# Patient Record
Sex: Female | Born: 1975 | Race: White | Hispanic: No | Marital: Married | Smoking: Former smoker
Health system: Southern US, Community
[De-identification: ages and names within clinical notes are randomized; demographics above are authoritative.]

## PROBLEM LIST (undated history)

## (undated) DIAGNOSIS — F419 Anxiety disorder, unspecified: Secondary | ICD-10-CM

## (undated) HISTORY — PX: AUGMENTATION MAMMAPLASTY: SUR837

## (undated) HISTORY — PX: TONSILLECTOMY: SUR1361

---

## 2003-04-28 ENCOUNTER — Other Ambulatory Visit: Admission: RE | Admit: 2003-04-28 | Discharge: 2003-04-28 | Payer: Self-pay | Admitting: Obstetrics and Gynecology

## 2004-09-06 ENCOUNTER — Other Ambulatory Visit: Admission: RE | Admit: 2004-09-06 | Discharge: 2004-09-06 | Payer: Self-pay | Admitting: Obstetrics and Gynecology

## 2008-07-17 ENCOUNTER — Ambulatory Visit: Payer: Self-pay | Admitting: Diagnostic Radiology

## 2008-07-17 ENCOUNTER — Emergency Department (HOSPITAL_BASED_OUTPATIENT_CLINIC_OR_DEPARTMENT_OTHER): Admission: EM | Admit: 2008-07-17 | Discharge: 2008-07-17 | Payer: Self-pay | Admitting: Emergency Medicine

## 2008-08-21 ENCOUNTER — Emergency Department (HOSPITAL_BASED_OUTPATIENT_CLINIC_OR_DEPARTMENT_OTHER): Admission: EM | Admit: 2008-08-21 | Discharge: 2008-08-21 | Payer: Self-pay | Admitting: Emergency Medicine

## 2009-01-19 ENCOUNTER — Encounter: Admission: RE | Admit: 2009-01-19 | Discharge: 2009-01-19 | Payer: Self-pay | Admitting: Obstetrics and Gynecology

## 2010-03-17 ENCOUNTER — Encounter: Payer: Self-pay | Admitting: Obstetrics and Gynecology

## 2010-05-24 ENCOUNTER — Other Ambulatory Visit: Payer: Self-pay | Admitting: Obstetrics and Gynecology

## 2010-05-24 DIAGNOSIS — Z803 Family history of malignant neoplasm of breast: Secondary | ICD-10-CM

## 2010-05-24 DIAGNOSIS — N6310 Unspecified lump in the right breast, unspecified quadrant: Secondary | ICD-10-CM

## 2010-05-31 ENCOUNTER — Ambulatory Visit
Admission: RE | Admit: 2010-05-31 | Discharge: 2010-05-31 | Disposition: A | Discharge status: Home / Self Care | Payer: BC Managed Care – PPO | Source: Ambulatory Visit | Attending: Obstetrics and Gynecology | Admitting: Obstetrics and Gynecology

## 2010-05-31 DIAGNOSIS — Z803 Family history of malignant neoplasm of breast: Secondary | ICD-10-CM

## 2010-05-31 DIAGNOSIS — N6310 Unspecified lump in the right breast, unspecified quadrant: Secondary | ICD-10-CM

## 2010-06-04 LAB — CBC
HCT: 39 % (ref 36.0–46.0)
Hemoglobin: 13.1 g/dL (ref 12.0–15.0)
MCHC: 33.6 g/dL (ref 30.0–36.0)
MCV: 84.5 fL (ref 78.0–100.0)
RBC: 4.61 MIL/uL (ref 3.87–5.11)

## 2010-06-04 LAB — DIFFERENTIAL
Basophils Relative: 1 % (ref 0–1)
Eosinophils Relative: 1 % (ref 0–5)
Monocytes Absolute: 0.6 10*3/uL (ref 0.1–1.0)
Monocytes Relative: 7 % (ref 3–12)
Neutro Abs: 5.5 10*3/uL (ref 1.7–7.7)

## 2010-06-04 LAB — COMPREHENSIVE METABOLIC PANEL
CO2: 27 mEq/L (ref 19–32)
Calcium: 9.4 mg/dL (ref 8.4–10.5)
Creatinine, Ser: 0.8 mg/dL (ref 0.4–1.2)
Glucose, Bld: 85 mg/dL (ref 70–99)

## 2010-06-04 LAB — URINALYSIS, ROUTINE W REFLEX MICROSCOPIC
Glucose, UA: NEGATIVE mg/dL
Hgb urine dipstick: NEGATIVE
Specific Gravity, Urine: 1.025 (ref 1.005–1.030)
Urobilinogen, UA: 1 mg/dL (ref 0.0–1.0)
pH: 7.5 (ref 5.0–8.0)

## 2010-06-05 ENCOUNTER — Other Ambulatory Visit: Payer: Self-pay | Admitting: Obstetrics and Gynecology

## 2010-06-05 DIAGNOSIS — Z803 Family history of malignant neoplasm of breast: Secondary | ICD-10-CM

## 2010-06-05 DIAGNOSIS — N6489 Other specified disorders of breast: Secondary | ICD-10-CM

## 2010-06-27 ENCOUNTER — Ambulatory Visit
Admission: RE | Admit: 2010-06-27 | Discharge: 2010-06-27 | Disposition: A | Discharge status: Home / Self Care | Payer: BC Managed Care – PPO | Source: Ambulatory Visit | Attending: Obstetrics and Gynecology | Admitting: Obstetrics and Gynecology

## 2010-06-27 DIAGNOSIS — N6489 Other specified disorders of breast: Secondary | ICD-10-CM

## 2010-06-27 DIAGNOSIS — Z803 Family history of malignant neoplasm of breast: Secondary | ICD-10-CM

## 2010-06-27 MED ORDER — GADOBENATE DIMEGLUMINE 529 MG/ML IV SOLN
17.0000 mL | Freq: Once | INTRAVENOUS | Status: AC | PRN
  Administered 2010-06-27: 17 mL via INTRAVENOUS

## 2010-08-16 ENCOUNTER — Other Ambulatory Visit (INDEPENDENT_AMBULATORY_CARE_PROVIDER_SITE_OTHER): Payer: Self-pay | Admitting: General Surgery

## 2010-08-16 DIAGNOSIS — R928 Other abnormal and inconclusive findings on diagnostic imaging of breast: Secondary | ICD-10-CM

## 2010-09-20 ENCOUNTER — Encounter (HOSPITAL_BASED_OUTPATIENT_CLINIC_OR_DEPARTMENT_OTHER)
Admission: RE | Admit: 2010-09-20 | Discharge: 2010-09-20 | Disposition: A | Discharge status: Home / Self Care | Payer: BC Managed Care – PPO | Source: Ambulatory Visit | Attending: General Surgery | Admitting: General Surgery

## 2010-09-20 LAB — DIFFERENTIAL
Basophils Relative: 1 % (ref 0–1)
Eosinophils Relative: 1 % (ref 0–5)
Monocytes Absolute: 0.5 10*3/uL (ref 0.1–1.0)
Monocytes Relative: 6 % (ref 3–12)
Neutro Abs: 5.3 10*3/uL (ref 1.7–7.7)

## 2010-09-20 LAB — CBC
HCT: 38.2 % (ref 36.0–46.0)
Hemoglobin: 12.7 g/dL (ref 12.0–15.0)
MCH: 28.3 pg (ref 26.0–34.0)
MCHC: 33.2 g/dL (ref 30.0–36.0)

## 2010-09-25 ENCOUNTER — Other Ambulatory Visit (INDEPENDENT_AMBULATORY_CARE_PROVIDER_SITE_OTHER): Payer: Self-pay | Admitting: General Surgery

## 2010-09-25 ENCOUNTER — Ambulatory Visit
Admission: RE | Admit: 2010-09-25 | Discharge: 2010-09-25 | Disposition: A | Discharge status: Home / Self Care | Payer: BC Managed Care – PPO | Source: Ambulatory Visit | Attending: General Surgery | Admitting: General Surgery

## 2010-09-25 ENCOUNTER — Ambulatory Visit (HOSPITAL_BASED_OUTPATIENT_CLINIC_OR_DEPARTMENT_OTHER)
Admission: RE | Admit: 2010-09-25 | Discharge: 2010-09-25 | Disposition: A | Discharge status: Home / Self Care | Payer: BC Managed Care – PPO | Source: Ambulatory Visit | Attending: General Surgery | Admitting: General Surgery

## 2010-09-25 DIAGNOSIS — Z01812 Encounter for preprocedural laboratory examination: Secondary | ICD-10-CM | POA: Insufficient documentation

## 2010-09-25 DIAGNOSIS — R928 Other abnormal and inconclusive findings on diagnostic imaging of breast: Secondary | ICD-10-CM

## 2010-09-25 DIAGNOSIS — L905 Scar conditions and fibrosis of skin: Secondary | ICD-10-CM | POA: Insufficient documentation

## 2010-09-25 DIAGNOSIS — Z803 Family history of malignant neoplasm of breast: Secondary | ICD-10-CM | POA: Insufficient documentation

## 2010-09-25 HISTORY — PX: BREAST BIOPSY: SHX20

## 2010-10-03 NOTE — Op Note (Signed)
NAMENEVADA, KIRCHNER NO.:  [id number]  MEDICAL RECORD NO.:  [id number]  LOCATION:                                 FACILITY:  PHYSICIAN:  Juanetta Gosling, MD     DATE OF BIRTH:  DATE OF PROCEDURE:  09/25/2010 DATE OF DISCHARGE:                              OPERATIVE REPORT   PREOPERATIVE DIAGNOSIS:  Right breast mammographic abnormality.  POSTOPERATIVE DIAGNOSIS:  Right breast mammographic abnormality.  PROCEDURE:  Right breast wire localization biopsy.  SURGEON:  Juanetta Gosling, MD  ASSISTANT:  None.  ANESTHESIA:  General.  SPECIMENS:  Right breast tissue marked with a long stitch lateral, short stitch superior, double stitch deep.  ESTIMATED BLOOD LOSS:  Minimal.  COMPLICATIONS:  None.  DRAINS:  None.  FINDINGS:  Mammographic confirmation of removal of wire and lesion.  DISPOSITION:  The patient to recovery room in stable condition.  INDICATIONS:  This is a 35 year old female who underwent a mammogram. Previously, it looked like there be an abnormal area of distortion.  She had a family history of breast cancer that has come up this year in her mother and underwent a followup mammogram which shows some distortion above the level of the nipple in the lateral portion of her breast it appeared to be.  She underwent an MR before she saw me, which was also negative.  She then came to see me for evaluation for excision as this was not amenable to radiologic-guided biopsy.  I discussed with due to her family history as well as a mammographic abnormality, I think this would be best served with an excision.  She was agreeable to that.  We discussed the risks and benefits associated with that procedure.  After informed consent was obtained, the patient was first taken [facility] where she had her wire placed by Dr. Anselmo Pickler.  I had the mammograms available for my review prior to beginning the operation.  She was then brought to  the operating room.  She underwent administration 1 gram of intravenous cefazolin.  She then had sequential compression devices placed.  She underwent general anesthesia with an LMA without complication.  Her right breast was then prepped and draped in standard sterile surgical fashion.  A surgical time-out was then performed.  I infiltrated 0.25% Marcaine throughout the lateral portion of the right breast.  I then made a radial incision overlying the wire tract. Cautery was then used to excise the wire as well as the surrounding tissue.  All of her breast tissue was very hard and there certainly appeared to be some cystic structures of all, but there certainly was no discrete mass associated with this.  I took this down to near her chest wall, but did not go all the way and remove her fascia with this.  The lesion was then passed off the table after being marked with short stitch superior, long stitch lateral, double stitch deep.  Confirmation of removal of lesion was then made with Dr. Jean Rosenthal.  I then obtained hemostasis.  I closed the breast with 3-0 Vicryl, 4-0 Monocryl, Steri-Strips, and dressing were placed over this.  She tolerated this well, extubated  in the operating room, and transferred to recovery room in stable condition.     Juanetta Gosling, MD     MCW/MEDQ  D:  09/25/2010  T:  09/26/2010  Job:  [id number]  cc:   Randye Lobo, M.D. Stoney Bang, MD  Electronically Signed by Emelia Loron MD on 10/03/2010 02:01:23 PM

## 2010-10-09 ENCOUNTER — Telehealth (INDEPENDENT_AMBULATORY_CARE_PROVIDER_SITE_OTHER): Payer: Self-pay

## 2010-10-09 NOTE — Telephone Encounter (Signed)
LMOM for pt to call me back to get her scheduled with Dr Dwain Sarna next wk after her lumpectomy/ [facility]

## 2010-10-15 ENCOUNTER — Telehealth (INDEPENDENT_AMBULATORY_CARE_PROVIDER_SITE_OTHER): Payer: Self-pay

## 2010-10-15 NOTE — Telephone Encounter (Signed)
LMOM  Stating that Dr Dwain Sarna wants to see her for her po appt and for her to please call me. I also lmom on 10-09-10 but never heard back from her.Hulda Humphrey

## 2010-10-30 ENCOUNTER — Encounter (INDEPENDENT_AMBULATORY_CARE_PROVIDER_SITE_OTHER): Payer: Self-pay | Admitting: General Surgery

## 2010-10-30 ENCOUNTER — Ambulatory Visit (INDEPENDENT_AMBULATORY_CARE_PROVIDER_SITE_OTHER): Payer: BC Managed Care – PPO | Admitting: General Surgery

## 2010-10-30 VITALS — BP 112/78 | HR 80

## 2010-10-30 DIAGNOSIS — Z09 Encounter for follow-up examination after completed treatment for conditions other than malignant neoplasm: Secondary | ICD-10-CM

## 2010-10-30 NOTE — Progress Notes (Signed)
Subjective:     Patient ID: Dawn Holder, female   DOB: [birth year], 35 y.o.   MRN: [id number]  HPI This is a 35 year old female who I initially saw in June of 2012 for a right breast mammographic abnormality that could not be biopsied stereotactically. I then took her to the operating room and a wire localization biopsy of this area with her pathology showing a radial scar. She has been well since her operation. She resumed her activity earlier this week and after doing pushups and a heavy work out she began developing pain in her axilla as well as around her incision. She also felt some warmth in her breast. This is gotten better with some rest.  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 I recommended to her scheduled anti-inflammatories for 5 days followed by p.r.n. I also recommended easy offer activity for a time. I asked her if this is not better in 3-4 weeks for her to come back and see me.  She has a significant family history of a mom with breast cancer at age 75, 2 aunts with breast cancer, one aunt with ovarian cancer and possibly another cousin with ovarian cancer as well. I discussed with her she now has an abnormality in her breast as well as a very significant family history. I recommended to her and evaluation at the cancer center for possible genetic counseling and testing as well as a high risk evaluation.

## 2011-02-07 ENCOUNTER — Other Ambulatory Visit: Payer: Self-pay | Admitting: Obstetrics and Gynecology

## 2011-03-01 ENCOUNTER — Telehealth: Payer: Self-pay | Admitting: Family

## 2011-03-01 NOTE — Telephone Encounter (Signed)
called pts lmovm to rtn call to schedule appt

## 2011-04-16 ENCOUNTER — Encounter: Payer: Self-pay | Admitting: *Deleted

## 2011-04-16 NOTE — Progress Notes (Signed)
Mailed letter to pt about High Risk clinic. 

## 2012-02-24 ENCOUNTER — Other Ambulatory Visit: Payer: Self-pay

## 2012-04-12 ENCOUNTER — Other Ambulatory Visit: Payer: Self-pay | Admitting: Obstetrics and Gynecology

## 2012-04-12 DIAGNOSIS — Z1231 Encounter for screening mammogram for malignant neoplasm of breast: Secondary | ICD-10-CM

## 2012-05-10 ENCOUNTER — Other Ambulatory Visit: Payer: Self-pay | Admitting: Otolaryngology

## 2012-05-10 ENCOUNTER — Ambulatory Visit: Payer: BC Managed Care – PPO

## 2012-06-14 ENCOUNTER — Ambulatory Visit: Payer: BC Managed Care – PPO

## 2013-02-23 ENCOUNTER — Other Ambulatory Visit: Payer: Self-pay | Admitting: Obstetrics & Gynecology

## 2013-02-23 DIAGNOSIS — N6002 Solitary cyst of left breast: Secondary | ICD-10-CM

## 2013-02-25 ENCOUNTER — Other Ambulatory Visit: Payer: Self-pay

## 2013-02-28 ENCOUNTER — Ambulatory Visit
Admission: RE | Admit: 2013-02-28 | Discharge: 2013-02-28 | Disposition: A | Discharge status: Home / Self Care | Payer: BC Managed Care – PPO | Source: Ambulatory Visit | Attending: Obstetrics & Gynecology | Admitting: Obstetrics & Gynecology

## 2013-02-28 ENCOUNTER — Other Ambulatory Visit: Payer: BC Managed Care – PPO

## 2013-02-28 DIAGNOSIS — N6002 Solitary cyst of left breast: Secondary | ICD-10-CM

## 2013-07-25 ENCOUNTER — Other Ambulatory Visit: Payer: Self-pay | Admitting: Family Medicine

## 2013-07-25 DIAGNOSIS — N6009 Solitary cyst of unspecified breast: Secondary | ICD-10-CM

## 2013-07-29 ENCOUNTER — Other Ambulatory Visit: Payer: Self-pay | Admitting: Gastroenterology

## 2013-07-29 DIAGNOSIS — K5900 Constipation, unspecified: Secondary | ICD-10-CM

## 2013-08-15 ENCOUNTER — Ambulatory Visit
Admission: RE | Admit: 2013-08-15 | Discharge: 2013-08-15 | Disposition: A | Discharge status: Home / Self Care | Payer: BC Managed Care – PPO | Source: Ambulatory Visit | Attending: Gastroenterology | Admitting: Gastroenterology

## 2013-08-15 DIAGNOSIS — K5900 Constipation, unspecified: Secondary | ICD-10-CM

## 2013-09-05 ENCOUNTER — Ambulatory Visit
Admission: RE | Admit: 2013-09-05 | Discharge: 2013-09-05 | Disposition: A | Discharge status: Home / Self Care | Payer: BC Managed Care – PPO | Source: Ambulatory Visit | Attending: Family Medicine | Admitting: Family Medicine

## 2013-09-05 DIAGNOSIS — N6009 Solitary cyst of unspecified breast: Secondary | ICD-10-CM

## 2013-09-21 ENCOUNTER — Emergency Department (HOSPITAL_BASED_OUTPATIENT_CLINIC_OR_DEPARTMENT_OTHER)
Admission: EM | Admit: 2013-09-21 | Discharge: 2013-09-21 | Disposition: A | Discharge status: Home / Self Care | Payer: BC Managed Care – PPO | Attending: Emergency Medicine | Admitting: Emergency Medicine

## 2013-09-21 ENCOUNTER — Encounter (HOSPITAL_BASED_OUTPATIENT_CLINIC_OR_DEPARTMENT_OTHER): Payer: Self-pay | Admitting: Emergency Medicine

## 2013-09-21 DIAGNOSIS — Z79899 Other long term (current) drug therapy: Secondary | ICD-10-CM | POA: Diagnosis not present

## 2013-09-21 DIAGNOSIS — R509 Fever, unspecified: Secondary | ICD-10-CM | POA: Diagnosis not present

## 2013-09-21 DIAGNOSIS — R197 Diarrhea, unspecified: Secondary | ICD-10-CM | POA: Diagnosis present

## 2013-09-21 DIAGNOSIS — R110 Nausea: Secondary | ICD-10-CM | POA: Diagnosis not present

## 2013-09-21 DIAGNOSIS — IMO0001 Reserved for inherently not codable concepts without codable children: Secondary | ICD-10-CM | POA: Diagnosis not present

## 2013-09-21 DIAGNOSIS — R5383 Other fatigue: Secondary | ICD-10-CM | POA: Diagnosis not present

## 2013-09-21 DIAGNOSIS — R5381 Other malaise: Secondary | ICD-10-CM | POA: Diagnosis not present

## 2013-09-21 DIAGNOSIS — R51 Headache: Secondary | ICD-10-CM | POA: Diagnosis not present

## 2013-09-21 DIAGNOSIS — Z3202 Encounter for pregnancy test, result negative: Secondary | ICD-10-CM | POA: Insufficient documentation

## 2013-09-21 DIAGNOSIS — Z87891 Personal history of nicotine dependence: Secondary | ICD-10-CM | POA: Diagnosis not present

## 2013-09-21 DIAGNOSIS — M791 Myalgia, unspecified site: Secondary | ICD-10-CM

## 2013-09-21 DIAGNOSIS — R638 Other symptoms and signs concerning food and fluid intake: Secondary | ICD-10-CM | POA: Diagnosis not present

## 2013-09-21 LAB — LIPASE, BLOOD: Lipase: 30 U/L (ref 11–59)

## 2013-09-21 LAB — COMPREHENSIVE METABOLIC PANEL
ALK PHOS: 49 U/L (ref 39–117)
ALT: 11 U/L (ref 0–35)
AST: 17 U/L (ref 0–37)
Albumin: 3.5 g/dL (ref 3.5–5.2)
Anion gap: 15 (ref 5–15)
BUN: 8 mg/dL (ref 6–23)
CO2: 19 meq/L (ref 19–32)
Calcium: 8.6 mg/dL (ref 8.4–10.5)
Chloride: 102 mEq/L (ref 96–112)
Creatinine, Ser: 0.8 mg/dL (ref 0.50–1.10)
GFR calc non Af Amer: >90 mL/min (ref >90)
GLUCOSE: 120 mg/dL — AB (ref 70–99)
Potassium: 3.4 mEq/L — ABNORMAL LOW (ref 3.7–5.3)
SODIUM: 136 meq/L — AB (ref 137–147)
TOTAL PROTEIN: 6.7 g/dL (ref 6.0–8.3)
Total Bilirubin: <0.2 mg/dL — ABNORMAL LOW (ref 0.3–1.2)

## 2013-09-21 LAB — CBC WITH DIFFERENTIAL/PLATELET
Basophils Absolute: 0 10*3/uL (ref 0.0–0.1)
Basophils Relative: 0 % (ref 0–1)
Eosinophils Absolute: 0 10*3/uL (ref 0.0–0.7)
Eosinophils Relative: 1 % (ref 0–5)
HCT: 39.1 % (ref 36.0–46.0)
Hemoglobin: 13 g/dL (ref 12.0–15.0)
LYMPHS ABS: 0.4 10*3/uL — AB (ref 0.7–4.0)
LYMPHS PCT: 8 % — AB (ref 12–46)
MCH: 28.8 pg (ref 26.0–34.0)
MCHC: 33.2 g/dL (ref 30.0–36.0)
MCV: 86.5 fL (ref 78.0–100.0)
Monocytes Absolute: 0.3 10*3/uL (ref 0.1–1.0)
Monocytes Relative: 8 % (ref 3–12)
NEUTROS PCT: 83 % — AB (ref 43–77)
Neutro Abs: 3.6 10*3/uL (ref 1.7–7.7)
PLATELETS: 164 10*3/uL (ref 150–400)
RBC: 4.52 MIL/uL (ref 3.87–5.11)
RDW: 13.4 % (ref 11.5–15.5)
WBC: 4.4 10*3/uL (ref 4.0–10.5)

## 2013-09-21 LAB — URINALYSIS, ROUTINE W REFLEX MICROSCOPIC
BILIRUBIN URINE: NEGATIVE
Glucose, UA: NEGATIVE mg/dL
KETONES UR: 15 mg/dL — AB
NITRITE: NEGATIVE
PROTEIN: NEGATIVE mg/dL
Specific Gravity, Urine: 1.017 (ref 1.005–1.030)
Urobilinogen, UA: 1 mg/dL (ref 0.0–1.0)
pH: 6 (ref 5.0–8.0)

## 2013-09-21 LAB — URINE MICROSCOPIC-ADD ON

## 2013-09-21 LAB — PREGNANCY, URINE: Preg Test, Ur: NEGATIVE

## 2013-09-21 MED ORDER — POTASSIUM CHLORIDE CRYS ER 20 MEQ PO TBCR
40.0000 meq | EXTENDED_RELEASE_TABLET | Freq: Once | ORAL | Status: AC
  Administered 2013-09-21: 40 meq via ORAL
  Filled 2013-09-21: qty 2

## 2013-09-21 MED ORDER — SODIUM CHLORIDE 0.9 % IV BOLUS (SEPSIS)
2000.0000 mL | Freq: Once | INTRAVENOUS | Status: AC
  Administered 2013-09-21: 2000 mL via INTRAVENOUS

## 2013-09-21 MED ORDER — ONDANSETRON HCL 4 MG/2ML IJ SOLN
4.0000 mg | Freq: Once | INTRAMUSCULAR | Status: AC
  Administered 2013-09-21: 4 mg via INTRAVENOUS
  Filled 2013-09-21: qty 2

## 2013-09-21 MED ORDER — LOPERAMIDE HCL 2 MG PO CAPS
4.0000 mg | ORAL_CAPSULE | ORAL | Status: DC | PRN
  Administered 2013-09-21: 4 mg via ORAL
  Filled 2013-09-21: qty 2

## 2013-09-21 MED ORDER — MORPHINE SULFATE 2 MG/ML IJ SOLN
2.0000 mg | Freq: Once | INTRAMUSCULAR | Status: AC
  Administered 2013-09-21: 2 mg via INTRAVENOUS
  Filled 2013-09-21: qty 1

## 2013-09-21 NOTE — ED Notes (Signed)
C/o diarrhea, chills x 4 days

## 2013-09-21 NOTE — ED Provider Notes (Signed)
CSN: [id number]     Arrival date & time 09/21/13  2012 History   First MD Initiated Contact with Patient 09/21/13 2024     Chief Complaint  Patient presents with  . Diarrhea     (Consider location/radiation/quality/duration/timing/severity/associated sxs/prior Treatment) The history is provided by the patient and medical records. No language interpreter was used.    Dawn Holder is a 38 y.o. female  with no major medical Hx presents to the Emergency Department complaining of gradual, persistent, progressively worsening diarrhea onset 3 days. Associated symptoms include nausea, chills, body aches, low grade fever (100.0 TMAX).  Pt reports several days of epigastric pain that has resolved.  Pt reports diarrhea is watery and without hematochezia.  Pt reports several black stools after taking pepto bismol, but this has resolved completely and it was never tarry or coffee ground appearing.  Pt reports she is having diarrhea multiple times per hour.  Pt reports lack of appetite and feeling poorly. Pt denies sick contacts, recent travel, recent antibiotics or visits to the hospital.  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   History reviewed. No pertinent past medical history. Past Surgical History  Procedure Laterality Date  . Breast biopsy  September 25, 2010    rt  . Tonsillectomy     Family History  Problem Relation Age of Onset  . Cancer Mother     breast   History  Substance Use Topics  . Smoking status: Former Games developermoker  . Smokeless tobacco: Not on file  . Alcohol Use: No   OB History   Grav Para Term Preterm Abortions TAB SAB Ect Mult Living                 Review of Systems  Constitutional: Positive for fever, chills, appetite change and fatigue. Negative for diaphoresis and unexpected weight change.  HENT: Negative for mouth sores.   Eyes: Negative for visual disturbance.  Respiratory: Negative for cough, chest tightness, shortness of breath and wheezing.   Cardiovascular:  Negative for chest pain.  Gastrointestinal: Positive for nausea, abdominal pain (epigastric, resolved) and diarrhea. Negative for vomiting and constipation.  Endocrine: Negative for polydipsia, polyphagia and polyuria.  Genitourinary: Negative for dysuria, urgency, frequency and hematuria.  Musculoskeletal: Positive for myalgias. Negative for back pain and neck stiffness.  Skin: Negative for rash and wound.  Allergic/Immunologic: Negative for immunocompromised state.  Neurological: Positive for headaches (mild, generalized). Negative for dizziness, syncope and light-headedness.  Hematological: Does not bruise/bleed easily.  Psychiatric/Behavioral: Negative for sleep disturbance. The patient is not nervous/anxious.       Allergies  Oxycodone  Home Medications   Prior to Admission medications   Medication Sig Start Date End Date Taking? Authorizing Provider  BUPROPION HBR ER PO Take by mouth.   Yes Historical Provider, MD  amphetamine-dextroamphetamine (ADDERALL) 20 MG tablet Take 20 mg by mouth daily.      Historical Provider, MD  levonorgestrel-ethinyl estradiol (NORDETTE) 0.15-30 MG-MCG tablet Take 1 tablet by mouth daily.      Historical Provider, MD  Multiple Vitamin (MULTIVITAMIN) capsule Take 1 capsule by mouth daily.      Historical Provider, MD  Omega-3 Fatty Acids (FISH OIL) 1200 MG CAPS Take by mouth daily.      Historical Provider, MD   BP 119/64  Pulse 88  Temp(Src) 98.2 F (36.8 C) (Oral)  Resp 18  Ht 5\' 11"  (1.803 m)  Wt 164 lb (74.39 kg)  BMI 22.88 kg/m2  SpO2 99%  LMP 09/14/2013 Physical Exam  Nursing note and vitals reviewed. Constitutional: She is oriented to person, place, and time. She appears well-developed and well-nourished. No distress.  Awake, alert, nontoxic appearance  HENT:  Head: Normocephalic and atraumatic.  Mouth/Throat: Oropharynx is clear and moist. No oropharyngeal exudate.  Eyes: Conjunctivae are normal. No scleral icterus.  Neck: Normal  range of motion. Neck supple.  Cardiovascular: Normal rate, regular rhythm, normal heart sounds and intact distal pulses.   No murmur heard. Pulmonary/Chest: Effort normal and breath sounds normal. No respiratory distress. She has no wheezes.  Abdominal: Soft. Bowel sounds are normal. She exhibits no distension and no mass. There is no tenderness. There is no rebound and no guarding.  Pt reports mild, generalized soreness without tenderness, rebound or peritoneal signs. Abdomen is soft without masses No CVA tenderness  Musculoskeletal: Normal range of motion. She exhibits no edema.  Neurological: She is alert and oriented to person, place, and time. She exhibits normal muscle tone. Coordination normal.  Speech is clear and goal oriented Moves extremities without ataxia  Skin: Skin is warm and dry. She is not diaphoretic. No erythema.  Psychiatric: She has a normal mood and affect. Her behavior is normal.    ED Course  Procedures (including critical care time) Labs Review Labs Reviewed  URINALYSIS, ROUTINE W REFLEX MICROSCOPIC - Abnormal; Notable for the following:    APPearance CLOUDY (*)    Hgb urine dipstick TRACE (*)    Ketones, ur 15 (*)    Leukocytes, UA SMALL (*)    All other components within normal limits  URINE MICROSCOPIC-ADD ON - Abnormal; Notable for the following:    Squamous Epithelial / LPF FEW (*)    All other components within normal limits  CBC WITH DIFFERENTIAL - Abnormal; Notable for the following:    Neutrophils Relative % 83 (*)    Lymphocytes Relative 8 (*)    Lymphs Abs 0.4 (*)    All other components within normal limits  COMPREHENSIVE METABOLIC PANEL - Abnormal; Notable for the following:    Sodium 136 (*)    Potassium 3.4 (*)    Glucose, Bld 120 (*)    Total Bilirubin <0.2 (*)    All other components within normal limits  OVA AND PARASITE EXAMINATION  PREGNANCY, URINE  LIPASE, BLOOD  GI PATHOGEN PANEL BY PCR, STOOL  POC OCCULT BLOOD, ED     Imaging Review No results found.   EKG Interpretation None      MDM   Final diagnoses:  Diarrhea  Myalgia   Dawn Holder presents with body aches, low-grade fever, resolved abdominal pain and watery diarrhea for 4 days.  Abdomen soft and nontender.  Will check basic labs and send stool cultures.  10:43 PM Labs unremarkable.  Patient unable to produce thecal sample here in the emergency department. She reports feeling better after fluid bolus and pain medications.  Vitals improved the patient is no longer tachycardic. Labs reassuring. Mild hypokalemia replaced here in the emergency department. No evidence of urinary tract infection.  Patient without high risk for C. difficile or traveler's diarrhea.    Patient without anemia and denies blood in her stool.  Unable to produce sample therefore no fecal occult either.  Highly doubt bloody diarrhea.  I have personally reviewed patient's vitals, nursing note and any pertinent labs or imaging.  I performed an undressed physical exam.    At this time, it has been determined that no acute conditions requiring further emergency intervention.  The patient/guardian have been advised of the diagnosis and plan. I reviewed all labs and imaging including any potential incidental findings. We have discussed signs and symptoms that warrant return to the ED, such as high fevers, persistent diarrhea, bloody diarrhea or vomiting.  Patient/guardian has voiced understanding and agreed to follow-up with the PCP or specialist in 24 hours.  Vital signs are stable at discharge.   BP 119/64  Pulse 88  Temp(Src) 98.2 F (36.8 C) (Oral)  Resp 18  Ht 5\' 11"  (1.803 m)  Wt 164 lb (74.39 kg)  BMI 22.88 kg/m2  SpO2 99%  LMP 09/14/2013        Dierdre Forth, PA-C 09/21/13 2257

## 2013-09-21 NOTE — Discharge Instructions (Signed)
1. Medications: imodium for diarrhea, usual home medications 2. Treatment: rest, drink plenty of fluids, collect stool sample as able 3. Follow Up: Please followup with your primary doctor tomorrow for discussion of your diagnoses and further evaluation after today's visit; if you do not have a primary care doctor use the resource guide provided to find one;     Diarrhea Diarrhea is frequent loose and watery bowel movements. It can cause you to feel weak and dehydrated. Dehydration can cause you to become tired and thirsty, have a dry mouth, and have decreased urination that often is dark yellow. Diarrhea is a sign of another problem, most often an infection that will not last long. In most cases, diarrhea typically lasts 2-3 days. However, it can last longer if it is a sign of something more serious. It is important to treat your diarrhea as directed by your caregiver to lessen or prevent future episodes of diarrhea. CAUSES  Some common causes include:  Gastrointestinal infections caused by viruses, bacteria, or parasites.  Food poisoning or food allergies.  Certain medicines, such as antibiotics, chemotherapy, and laxatives.  Artificial sweeteners and fructose.  Digestive disorders. HOME CARE INSTRUCTIONS  Ensure adequate fluid intake (hydration): Have 1 cup (8 oz) of fluid for each diarrhea episode. Avoid fluids that contain simple sugars or sports drinks, fruit juices, whole milk products, and sodas. Your urine should be clear or pale yellow if you are drinking enough fluids. Hydrate with an oral rehydration solution that you can purchase at pharmacies, retail stores, and online. You can prepare an oral rehydration solution at home by mixing the following ingredients together:   - tsp table salt.   tsp baking soda.   tsp salt substitute containing potassium chloride.  1  tablespoons sugar.  1 L (34 oz) of water.  Certain foods and beverages may increase the speed at which food  moves through the gastrointestinal (GI) tract. These foods and beverages should be avoided and include:  Caffeinated and alcoholic beverages.  High-fiber foods, such as raw fruits and vegetables, nuts, seeds, and whole grain breads and cereals.  Foods and beverages sweetened with sugar alcohols, such as xylitol, sorbitol, and mannitol.  Some foods may be well tolerated and may help thicken stool including:  Starchy foods, such as rice, toast, pasta, low-sugar cereal, oatmeal, grits, baked potatoes, crackers, and bagels.  Bananas.  Applesauce.  Add probiotic-rich foods to help increase healthy bacteria in the GI tract, such as yogurt and fermented milk products.  Wash your hands well after each diarrhea episode.  Only take over-the-counter or prescription medicines as directed by your caregiver.  Take a warm bath to relieve any burning or pain from frequent diarrhea episodes. SEEK IMMEDIATE MEDICAL CARE IF:   You are unable to keep fluids down.  You have persistent vomiting.  You have blood in your stool, or your stools are black and tarry.  You do not urinate in 6-8 hours, or there is only a small amount of very dark urine.  You have abdominal pain that increases or localizes.  You have weakness, dizziness, confusion, or light-headedness.  You have a severe headache.  Your diarrhea gets worse or does not get better.  You have a fever or persistent symptoms for more than 2-3 days.  You have a fever and your symptoms suddenly get worse. MAKE SURE YOU:   Understand these instructions.  Will watch your condition.  Will get help right away if you are not doing well or get  worse. Document Released: 01/31/2002 Document Revised: 06/27/2013 Document Reviewed: 10/19/2011 [facility] Patient Information 2015 [place], [place]. This information is not intended to replace advice given to you by your health care provider. Make sure you discuss any questions you have with your health  care provider.  Food Choices to Help Relieve Diarrhea When you have diarrhea, the foods you eat and your eating habits are very important. Choosing the right foods and drinks can help relieve diarrhea. Also, because diarrhea can last up to 7 days, you need to replace lost fluids and electrolytes (such as sodium, potassium, and chloride) in order to help prevent dehydration.  WHAT GENERAL GUIDELINES DO I NEED TO FOLLOW?  Slowly drink 1 cup (8 oz) of fluid for each episode of diarrhea. If you are getting enough fluid, your urine will be clear or pale yellow.  Eat starchy foods. Some good choices include white rice, white toast, pasta, low-fiber cereal, baked potatoes (without the skin), saltine crackers, and bagels.  Avoid large servings of any cooked vegetables.  Limit fruit to two servings per day. A serving is  cup or 1 small piece.  Choose foods with less than 2 g of fiber per serving.  Limit fats to less than 8 tsp (38 g) per day.  Avoid fried foods.  Eat foods that have probiotics in them. Probiotics can be found in certain dairy products.  Avoid foods and beverages that may increase the speed at which food moves through the stomach and intestines (gastrointestinal tract). Things to avoid include:  High-fiber foods, such as dried fruit, raw fruits and vegetables, nuts, seeds, and whole grain foods.  Spicy foods and high-fat foods.  Foods and beverages sweetened with high-fructose corn syrup, honey, or sugar alcohols such as xylitol, sorbitol, and mannitol. WHAT FOODS ARE RECOMMENDED? Grains White rice. White, [place], or pita breads (fresh or toasted), including plain rolls, buns, or bagels. White pasta. Saltine, soda, or graham crackers. Pretzels. Low-fiber cereal. Cooked cereals made with water (such as cornmeal, farina, or cream cereals). Plain muffins. Matzo. Melba toast. Zwieback.  Vegetables Potatoes (without the skin). Strained tomato and vegetable juices. Most well-cooked  and canned vegetables without seeds. Tender lettuce. Fruits Cooked or canned applesauce, apricots, cherries, fruit cocktail, grapefruit, peaches, pears, or plums. Fresh bananas, apples without skin, cherries, grapes, cantaloupe, grapefruit, peaches, oranges, or plums.  Meat and Other Protein Products Baked or boiled chicken. Eggs. Tofu. Fish. Seafood. Smooth peanut butter. Ground or well-cooked tender beef, ham, veal, lamb, pork, or poultry.  Dairy Plain yogurt, kefir, and unsweetened liquid yogurt. Lactose-free milk, buttermilk, or soy milk. Plain hard cheese. Beverages Sport drinks. Clear broths. Diluted fruit juices (except prune). Regular, caffeine-free sodas such as ginger ale. Water. Decaffeinated teas. Oral rehydration solutions. Sugar-free beverages not sweetened with sugar alcohols. Other Bouillon, broth, or soups made from recommended foods.  The items listed above may not be a complete list of recommended foods or beverages. Contact your dietitian for more options. WHAT FOODS ARE NOT RECOMMENDED? Grains Whole grain, whole wheat, bran, or rye breads, rolls, pastas, crackers, and cereals. Wild or brown rice. Cereals that contain more than 2 g of fiber per serving. Corn tortillas or taco shells. Cooked or dry oatmeal. Granola. Popcorn. Vegetables Raw vegetables. Cabbage, broccoli, Brussels sprouts, artichokes, baked beans, beet greens, corn, kale, legumes, peas, sweet potatoes, and yams. Potato skins. Cooked spinach and cabbage. Fruits Dried fruit, including raisins and dates. Raw fruits. Stewed or dried prunes. Fresh apples with skin, apricots, mangoes, pears, raspberries,  and strawberries.  Meat and Other Protein Products Chunky peanut butter. Nuts and seeds. Beans and lentils. Tomasa Blase.  Dairy High-fat cheeses. Milk, chocolate milk, and beverages made with milk, such as milk shakes. Cream. Ice cream. Sweets and Desserts Sweet rolls, doughnuts, and sweet breads. Pancakes and  waffles. Fats and Oils Butter. Cream sauces. Margarine. Salad oils. Plain salad dressings. Olives. Avocados.  Beverages Caffeinated beverages (such as coffee, tea, soda, or energy drinks). Alcoholic beverages. Fruit juices with pulp. Prune juice. Soft drinks sweetened with high-fructose corn syrup or sugar alcohols. Other Coconut. Hot sauce. Chili powder. Mayonnaise. Gravy. Cream-based or milk-based soups.  The items listed above may not be a complete list of foods and beverages to avoid. Contact your dietitian for more information. WHAT SHOULD I DO IF I BECOME DEHYDRATED? Diarrhea can sometimes lead to dehydration. Signs of dehydration include dark urine and dry mouth and skin. If you think you are dehydrated, you should rehydrate with an oral rehydration solution. These solutions can be purchased at pharmacies, retail stores, or online.  Drink -1 cup (120-240 mL) of oral rehydration solution each time you have an episode of diarrhea. If drinking this amount makes your diarrhea worse, try drinking smaller amounts more often. For example, drink 1-3 tsp (5-15 mL) every 5-10 minutes.  A general rule for staying hydrated is to drink 1-2 L of fluid per day. Talk to your health care provider about the specific amount you should be drinking each day. Drink enough fluids to keep your urine clear or pale yellow. Document Released: 05/03/2003 Document Revised: 02/15/2013 Document Reviewed: 01/03/2013 [facility] Patient Information 2015 [place], [place]. This information is not intended to replace advice given to you by your health care provider. Make sure you discuss any questions you have with your health care provider.

## 2013-09-22 NOTE — ED Provider Notes (Signed)
Medical screening examination/treatment/procedure(s) were performed by non-physician practitioner and as supervising physician I was immediately available for consultation/collaboration.   EKG Interpretation None        Sherley Leser S Kathyrn Warmuth, MD 09/22/13 1530 

## 2014-04-03 ENCOUNTER — Other Ambulatory Visit: Payer: Self-pay

## 2014-04-03 DIAGNOSIS — Z1231 Encounter for screening mammogram for malignant neoplasm of breast: Secondary | ICD-10-CM

## 2014-04-10 ENCOUNTER — Ambulatory Visit: Payer: Self-pay

## 2014-05-01 ENCOUNTER — Ambulatory Visit: Payer: Self-pay

## 2014-05-22 ENCOUNTER — Ambulatory Visit: Payer: Self-pay

## 2014-05-25 ENCOUNTER — Ambulatory Visit
Admission: RE | Admit: 2014-05-25 | Discharge: 2014-05-25 | Disposition: A | Discharge status: Home / Self Care | Payer: BLUE CROSS/BLUE SHIELD | Source: Ambulatory Visit

## 2014-05-25 DIAGNOSIS — Z1231 Encounter for screening mammogram for malignant neoplasm of breast: Secondary | ICD-10-CM

## 2014-05-26 ENCOUNTER — Other Ambulatory Visit: Payer: Self-pay | Admitting: Family Medicine

## 2014-05-26 DIAGNOSIS — R928 Other abnormal and inconclusive findings on diagnostic imaging of breast: Secondary | ICD-10-CM

## 2014-06-05 ENCOUNTER — Ambulatory Visit
Admission: RE | Admit: 2014-06-05 | Discharge: 2014-06-05 | Disposition: A | Discharge status: Home / Self Care | Payer: BLUE CROSS/BLUE SHIELD | Source: Ambulatory Visit | Attending: Family Medicine | Admitting: Family Medicine

## 2014-06-05 DIAGNOSIS — R928 Other abnormal and inconclusive findings on diagnostic imaging of breast: Secondary | ICD-10-CM

## 2014-06-27 ENCOUNTER — Emergency Department (HOSPITAL_BASED_OUTPATIENT_CLINIC_OR_DEPARTMENT_OTHER): Payer: BLUE CROSS/BLUE SHIELD

## 2014-06-27 ENCOUNTER — Encounter (HOSPITAL_BASED_OUTPATIENT_CLINIC_OR_DEPARTMENT_OTHER): Payer: Self-pay | Admitting: *Deleted

## 2014-06-27 ENCOUNTER — Emergency Department (HOSPITAL_BASED_OUTPATIENT_CLINIC_OR_DEPARTMENT_OTHER)
Admission: EM | Admit: 2014-06-27 | Discharge: 2014-06-27 | Disposition: A | Discharge status: Home / Self Care | Payer: BLUE CROSS/BLUE SHIELD | Attending: Emergency Medicine | Admitting: Emergency Medicine

## 2014-06-27 DIAGNOSIS — R05 Cough: Secondary | ICD-10-CM

## 2014-06-27 DIAGNOSIS — R1110 Vomiting, unspecified: Secondary | ICD-10-CM | POA: Insufficient documentation

## 2014-06-27 DIAGNOSIS — F419 Anxiety disorder, unspecified: Secondary | ICD-10-CM | POA: Insufficient documentation

## 2014-06-27 DIAGNOSIS — J40 Bronchitis, not specified as acute or chronic: Secondary | ICD-10-CM | POA: Diagnosis not present

## 2014-06-27 DIAGNOSIS — Z87891 Personal history of nicotine dependence: Secondary | ICD-10-CM | POA: Diagnosis not present

## 2014-06-27 DIAGNOSIS — R059 Cough, unspecified: Secondary | ICD-10-CM

## 2014-06-27 DIAGNOSIS — Z792 Long term (current) use of antibiotics: Secondary | ICD-10-CM | POA: Insufficient documentation

## 2014-06-27 DIAGNOSIS — Z793 Long term (current) use of hormonal contraceptives: Secondary | ICD-10-CM | POA: Diagnosis not present

## 2014-06-27 HISTORY — DX: Anxiety disorder, unspecified: F41.9

## 2014-06-27 MED ORDER — HYDROCOD POLST-CPM POLST ER 10-8 MG/5ML PO SUER: 5.0000 mL | Freq: Two times a day (BID) | ORAL | Status: AC | PRN

## 2014-06-27 MED ORDER — PREDNISONE 20 MG PO TABS: ORAL_TABLET | ORAL | Status: AC

## 2014-06-27 MED ORDER — ALBUTEROL SULFATE HFA 108 (90 BASE) MCG/ACT IN AERS
2.0000 | INHALATION_SPRAY | RESPIRATORY_TRACT | Status: DC | PRN
  Administered 2014-06-27: 2 via RESPIRATORY_TRACT
  Filled 2014-06-27: qty 6.7

## 2014-06-27 NOTE — Discharge Instructions (Signed)
Cough, Adult  A cough is a reflex that helps clear your throat and airways. It can help heal the body or may be a reaction to an irritated airway. A cough may only last 2 or 3 weeks (acute) or may last more than 8 weeks (chronic).  CAUSES Acute cough:  Viral or bacterial infections. Chronic cough:  Infections.  Allergies.  Asthma.  Post-nasal drip.  Smoking.  Heartburn or acid reflux.  Some medicines.  Chronic lung problems (COPD).  Cancer. SYMPTOMS   Cough.  Fever.  Chest pain.  Increased breathing rate.  High-pitched whistling sound when breathing (wheezing).  Colored mucus that you cough up (sputum). TREATMENT   A bacterial cough may be treated with antibiotic medicine.  A viral cough must run its course and will not respond to antibiotics.  Your caregiver may recommend other treatments if you have a chronic cough. HOME CARE INSTRUCTIONS   Only take over-the-counter or prescription medicines for pain, discomfort, or fever as directed by your caregiver. Use cough suppressants only as directed by your caregiver.  Use a cold steam vaporizer or humidifier in your bedroom or home to help loosen secretions.  Sleep in a semi-upright position if your cough is worse at night.  Rest as needed.  Stop smoking if you smoke. SEEK IMMEDIATE MEDICAL CARE IF:   You have pus in your sputum.  Your cough starts to worsen.  You cannot control your cough with suppressants and are losing sleep.  You begin coughing up blood.  You have difficulty breathing.  You develop pain which is getting worse or is uncontrolled with medicine.  You have a fever. MAKE SURE YOU:   Understand these instructions.  Will watch your condition.  Will get help right away if you are not doing well or get worse. Document Released: 08/09/2010 Document Revised: 05/05/2011 Document Reviewed: 08/09/2010 [employer] Patient Information 2015 [employer]. This information is not intended  to replace advice given to you by your health care provider. Make sure you discuss any questions you have with your health care provider.  

## 2014-06-27 NOTE — ED Notes (Signed)
Pt amb to triage with quick steady gait in nad. Pt reports cough, sore throat x last wed, seen at urgent care and rx amox for ear infection, cough syrup and tessilon pearls. Pt states sx are no better. Denies any fevers.

## 2014-06-27 NOTE — ED Provider Notes (Signed)
CSN: [id number]     Arrival date & time 06/27/14  1413 History   First MD Initiated Contact with Patient 06/27/14 1507     Chief Complaint  Patient presents with  . Cough     (Consider location/radiation/quality/duration/timing/severity/associated sxs/prior Treatment) HPI Comments: Patient presents with cough and congestion. She states that she's had about a one-week history of runny nose congestion coughing. She initially was using over-the-counter cough medicines and then was seen at an urgent care 4 days ago and started on amoxicillin for an ear infection as well as promethazine DM cough syrup and Tessalon Perles. She states that the cough medicine is her only health maintenance making her dizzy. She states the cough is worsening. She's not coughing up any sputum. Cough is worse when she lays down. She feels short of breath at times. She has no chest pain. No leg swelling. No fevers but she is having chills. She did have an episode of vomiting last night that was associated with coughing.  Patient is a 39 y.o. female presenting with cough.  Cough Associated symptoms: chills, rhinorrhea and shortness of breath   Associated symptoms: no chest pain, no diaphoresis, no fever, no headaches and no rash     Past Medical History  Diagnosis Date  . Anxiety    Past Surgical History  Procedure Laterality Date  . Breast biopsy  September 25, 2010    rt  . Tonsillectomy     Family History  Problem Relation Age of Onset  . Cancer Mother     breast   History  Substance Use Topics  . Smoking status: Former Games developer  . Smokeless tobacco: Not on file  . Alcohol Use: No   OB History    No data available     Review of Systems  Constitutional: Positive for chills, appetite change and fatigue. Negative for fever and diaphoresis.  HENT: Positive for congestion, postnasal drip, rhinorrhea and voice change (hoarseness). Negative for sneezing and trouble swallowing.   Eyes: Negative.   Respiratory:  Positive for cough and shortness of breath. Negative for chest tightness.   Cardiovascular: Negative for chest pain and leg swelling.  Gastrointestinal: Positive for vomiting. Negative for nausea, abdominal pain, diarrhea and blood in stool.  Genitourinary: Negative for frequency, hematuria, flank pain and difficulty urinating.  Musculoskeletal: Negative for back pain and arthralgias.  Skin: Negative for rash.  Neurological: Negative for dizziness, speech difficulty, weakness, numbness and headaches.      Allergies  Oxycodone  Home Medications   Prior to Admission medications   Medication Sig Start Date End Date Taking? Authorizing Provider  amoxicillin (AMOXIL) 875 MG tablet Take 875 mg by mouth 2 (two) times daily.   Yes Historical Provider, MD  amphetamine-dextroamphetamine (ADDERALL) 20 MG tablet Take 20 mg by mouth daily.      Historical Provider, MD  BUPROPION HBR ER PO Take by mouth.    Historical Provider, MD  chlorpheniramine-HYDROcodone (TUSSIONEX PENNKINETIC ER) 10-8 MG/5ML SUER Take 5 mLs by mouth every 12 (twelve) hours as needed for cough. 06/27/14   Rolan Bucco, MD  levonorgestrel-ethinyl estradiol (NORDETTE) 0.15-30 MG-MCG tablet Take 1 tablet by mouth daily.      Historical Provider, MD  Multiple Vitamin (MULTIVITAMIN) capsule Take 1 capsule by mouth daily.      Historical Provider, MD  Omega-3 Fatty Acids (FISH OIL) 1200 MG CAPS Take by mouth daily.      Historical Provider, MD  predniSONE (DELTASONE) 20 MG tablet 3 tabs po  day one, then 2 po daily x 4 days 06/27/14   Rolan BuccoMelanie George Haggart, MD   BP 130/64 mmHg  Pulse 87  Resp 20  Ht 5\' 11"  (1.803 m)  Wt 160 lb (72.576 kg)  BMI 22.33 kg/m2  SpO2 100%  LMP 06/19/2014 Physical Exam  Constitutional: She is oriented to person, place, and time. She appears well-developed and well-nourished.  HENT:  Head: Normocephalic and atraumatic.  Right Ear: External ear normal.  Left Ear: External ear normal.  Mouth/Throat: Oropharynx  is clear and moist.  Eyes: Pupils are equal, round, and reactive to light.  Neck: Normal range of motion. Neck supple.  Cardiovascular: Normal rate, regular rhythm and normal heart sounds.   Pulmonary/Chest: Effort normal. No respiratory distress. She has wheezes (Mild expiratory wheezing and rhonchi bilaterally). She has no rales. She exhibits no tenderness.  No increased work of breathing  Abdominal: Soft. Bowel sounds are normal. There is no tenderness. There is no rebound and no guarding.  Musculoskeletal: Normal range of motion. She exhibits no edema.  No edema or calf tenderness  Lymphadenopathy:    She has no cervical adenopathy.  Neurological: She is alert and oriented to person, place, and time.  Skin: Skin is warm and dry. No rash noted.  Psychiatric: She has a normal mood and affect.    ED Course  Procedures (including critical care time) Labs Review Labs Reviewed - No data to display  Imaging Review Dg Chest 2 View  06/27/2014   CLINICAL DATA:  Cough, congestion.  EXAM: CHEST  2 VIEW  COMPARISON:  None  FINDINGS: Heart and mediastinal contours are within normal limits. No focal opacities or effusions. No acute bony abnormality. Rightward scoliosis in the thoracic spine.  IMPRESSION: No active cardiopulmonary disease.   Electronically Signed   By: Charlett NoseKevin  Dover M.D.   On: 06/27/2014 15:36     EKG Interpretation None      MDM   Final diagnoses:  Cough  Bronchitis    Patient presents with cough and chest congestion. There is no evidence of pneumonia. Her oxygen saturations are normal and she has no increased work of breathing. She has no suggestions of pulmonary embolus. She does have some mild wheezing and signs of bronchitis. She is already on amoxicillin and I told her to finish the course of life suspect that this is more likely a viral infection. She was given an albuterol MDI to use at home as well as a prescription for prednisone pack and Tussionex cough syrup. She  is allergic to oxycodone but states that she's taken hydrocodone in the past without any problem. I advised her to stop using the Phenergan DM as she was not liking the side effects of this medicine. Advised her follow-up with her primary care physician if her symptoms are not improving.    Rolan BuccoMelanie Zenita Kister, MD 06/27/14 [phone]

## 2015-04-30 ENCOUNTER — Other Ambulatory Visit: Payer: Self-pay

## 2015-04-30 DIAGNOSIS — Z1231 Encounter for screening mammogram for malignant neoplasm of breast: Secondary | ICD-10-CM

## 2015-06-08 ENCOUNTER — Ambulatory Visit
Admission: RE | Admit: 2015-06-08 | Discharge: 2015-06-08 | Disposition: A | Discharge status: Home / Self Care | Payer: BLUE CROSS/BLUE SHIELD | Source: Ambulatory Visit

## 2015-06-08 DIAGNOSIS — Z1231 Encounter for screening mammogram for malignant neoplasm of breast: Secondary | ICD-10-CM

## 2015-06-11 ENCOUNTER — Ambulatory Visit: Payer: BLUE CROSS/BLUE SHIELD

## 2016-01-28 DIAGNOSIS — F329 Major depressive disorder, single episode, unspecified: Secondary | ICD-10-CM | POA: Diagnosis not present

## 2016-01-31 DIAGNOSIS — F329 Major depressive disorder, single episode, unspecified: Secondary | ICD-10-CM | POA: Diagnosis not present

## 2016-03-03 DIAGNOSIS — F329 Major depressive disorder, single episode, unspecified: Secondary | ICD-10-CM | POA: Diagnosis not present

## 2016-03-10 DIAGNOSIS — F329 Major depressive disorder, single episode, unspecified: Secondary | ICD-10-CM | POA: Diagnosis not present

## 2016-03-17 DIAGNOSIS — F329 Major depressive disorder, single episode, unspecified: Secondary | ICD-10-CM | POA: Diagnosis not present

## 2016-03-24 DIAGNOSIS — N939 Abnormal uterine and vaginal bleeding, unspecified: Secondary | ICD-10-CM | POA: Diagnosis not present

## 2016-03-24 DIAGNOSIS — N930 Postcoital and contact bleeding: Secondary | ICD-10-CM | POA: Diagnosis not present

## 2016-03-24 DIAGNOSIS — D259 Leiomyoma of uterus, unspecified: Secondary | ICD-10-CM | POA: Diagnosis not present

## 2016-03-24 DIAGNOSIS — Z1151 Encounter for screening for human papillomavirus (HPV): Secondary | ICD-10-CM | POA: Diagnosis not present

## 2016-03-24 DIAGNOSIS — Z6820 Body mass index (BMI) 20.0-20.9, adult: Secondary | ICD-10-CM | POA: Diagnosis not present

## 2016-03-24 DIAGNOSIS — Z124 Encounter for screening for malignant neoplasm of cervix: Secondary | ICD-10-CM | POA: Diagnosis not present

## 2016-03-31 DIAGNOSIS — F329 Major depressive disorder, single episode, unspecified: Secondary | ICD-10-CM | POA: Diagnosis not present

## 2016-04-07 DIAGNOSIS — F329 Major depressive disorder, single episode, unspecified: Secondary | ICD-10-CM | POA: Diagnosis not present

## 2016-04-14 DIAGNOSIS — F329 Major depressive disorder, single episode, unspecified: Secondary | ICD-10-CM | POA: Diagnosis not present

## 2016-04-28 DIAGNOSIS — F329 Major depressive disorder, single episode, unspecified: Secondary | ICD-10-CM | POA: Diagnosis not present

## 2016-05-05 DIAGNOSIS — F329 Major depressive disorder, single episode, unspecified: Secondary | ICD-10-CM | POA: Diagnosis not present

## 2016-05-12 DIAGNOSIS — F329 Major depressive disorder, single episode, unspecified: Secondary | ICD-10-CM | POA: Diagnosis not present

## 2016-05-19 DIAGNOSIS — F329 Major depressive disorder, single episode, unspecified: Secondary | ICD-10-CM | POA: Diagnosis not present

## 2016-06-02 DIAGNOSIS — F329 Major depressive disorder, single episode, unspecified: Secondary | ICD-10-CM | POA: Diagnosis not present

## 2016-06-09 DIAGNOSIS — F329 Major depressive disorder, single episode, unspecified: Secondary | ICD-10-CM | POA: Diagnosis not present

## 2016-06-16 DIAGNOSIS — F329 Major depressive disorder, single episode, unspecified: Secondary | ICD-10-CM | POA: Diagnosis not present

## 2016-06-23 DIAGNOSIS — F329 Major depressive disorder, single episode, unspecified: Secondary | ICD-10-CM | POA: Diagnosis not present

## 2016-07-07 DIAGNOSIS — D259 Leiomyoma of uterus, unspecified: Secondary | ICD-10-CM | POA: Diagnosis not present

## 2016-07-07 DIAGNOSIS — F329 Major depressive disorder, single episode, unspecified: Secondary | ICD-10-CM | POA: Diagnosis not present

## 2016-07-07 DIAGNOSIS — Z01818 Encounter for other preprocedural examination: Secondary | ICD-10-CM | POA: Diagnosis not present

## 2016-07-07 DIAGNOSIS — Z6821 Body mass index (BMI) 21.0-21.9, adult: Secondary | ICD-10-CM | POA: Diagnosis not present

## 2016-07-07 DIAGNOSIS — N939 Abnormal uterine and vaginal bleeding, unspecified: Secondary | ICD-10-CM | POA: Diagnosis not present

## 2016-07-14 DIAGNOSIS — Z01818 Encounter for other preprocedural examination: Secondary | ICD-10-CM | POA: Diagnosis not present

## 2016-07-17 DIAGNOSIS — N72 Inflammatory disease of cervix uteri: Secondary | ICD-10-CM | POA: Diagnosis not present

## 2016-07-17 DIAGNOSIS — N938 Other specified abnormal uterine and vaginal bleeding: Secondary | ICD-10-CM | POA: Diagnosis not present

## 2016-07-17 DIAGNOSIS — D251 Intramural leiomyoma of uterus: Secondary | ICD-10-CM | POA: Diagnosis not present

## 2016-07-17 DIAGNOSIS — Z885 Allergy status to narcotic agent status: Secondary | ICD-10-CM | POA: Diagnosis not present

## 2016-07-17 DIAGNOSIS — D259 Leiomyoma of uterus, unspecified: Secondary | ICD-10-CM | POA: Diagnosis not present

## 2016-07-17 DIAGNOSIS — Z803 Family history of malignant neoplasm of breast: Secondary | ICD-10-CM | POA: Diagnosis not present

## 2016-07-17 DIAGNOSIS — D252 Subserosal leiomyoma of uterus: Secondary | ICD-10-CM | POA: Diagnosis not present

## 2016-07-17 DIAGNOSIS — N920 Excessive and frequent menstruation with regular cycle: Secondary | ICD-10-CM | POA: Diagnosis not present

## 2016-07-18 DIAGNOSIS — N920 Excessive and frequent menstruation with regular cycle: Secondary | ICD-10-CM | POA: Diagnosis not present

## 2016-07-18 DIAGNOSIS — N72 Inflammatory disease of cervix uteri: Secondary | ICD-10-CM | POA: Diagnosis not present

## 2016-07-18 DIAGNOSIS — Z803 Family history of malignant neoplasm of breast: Secondary | ICD-10-CM | POA: Diagnosis not present

## 2016-07-18 DIAGNOSIS — D252 Subserosal leiomyoma of uterus: Secondary | ICD-10-CM | POA: Diagnosis not present

## 2016-07-18 DIAGNOSIS — N938 Other specified abnormal uterine and vaginal bleeding: Secondary | ICD-10-CM | POA: Diagnosis not present

## 2016-07-18 DIAGNOSIS — D251 Intramural leiomyoma of uterus: Secondary | ICD-10-CM | POA: Diagnosis not present

## 2016-07-18 DIAGNOSIS — Z885 Allergy status to narcotic agent status: Secondary | ICD-10-CM | POA: Diagnosis not present

## 2016-07-28 DIAGNOSIS — F329 Major depressive disorder, single episode, unspecified: Secondary | ICD-10-CM | POA: Diagnosis not present

## 2016-07-30 DIAGNOSIS — F329 Major depressive disorder, single episode, unspecified: Secondary | ICD-10-CM | POA: Diagnosis not present

## 2016-08-11 DIAGNOSIS — F329 Major depressive disorder, single episode, unspecified: Secondary | ICD-10-CM | POA: Diagnosis not present

## 2016-08-13 DIAGNOSIS — F329 Major depressive disorder, single episode, unspecified: Secondary | ICD-10-CM | POA: Diagnosis not present

## 2016-08-18 DIAGNOSIS — R1030 Lower abdominal pain, unspecified: Secondary | ICD-10-CM | POA: Diagnosis not present

## 2016-08-18 DIAGNOSIS — N898 Other specified noninflammatory disorders of vagina: Secondary | ICD-10-CM | POA: Diagnosis not present

## 2016-08-20 DIAGNOSIS — F1920 Other psychoactive substance dependence, uncomplicated: Secondary | ICD-10-CM | POA: Diagnosis not present

## 2016-08-20 DIAGNOSIS — F329 Major depressive disorder, single episode, unspecified: Secondary | ICD-10-CM | POA: Diagnosis not present

## 2016-08-20 DIAGNOSIS — R41840 Attention and concentration deficit: Secondary | ICD-10-CM | POA: Diagnosis not present

## 2016-08-20 DIAGNOSIS — Z79899 Other long term (current) drug therapy: Secondary | ICD-10-CM | POA: Diagnosis not present

## 2016-08-20 DIAGNOSIS — F419 Anxiety disorder, unspecified: Secondary | ICD-10-CM | POA: Diagnosis not present

## 2016-08-20 DIAGNOSIS — F902 Attention-deficit hyperactivity disorder, combined type: Secondary | ICD-10-CM | POA: Diagnosis not present

## 2016-08-25 DIAGNOSIS — F329 Major depressive disorder, single episode, unspecified: Secondary | ICD-10-CM | POA: Diagnosis not present

## 2016-09-08 DIAGNOSIS — F329 Major depressive disorder, single episode, unspecified: Secondary | ICD-10-CM | POA: Diagnosis not present

## 2016-09-15 DIAGNOSIS — F329 Major depressive disorder, single episode, unspecified: Secondary | ICD-10-CM | POA: Diagnosis not present

## 2016-09-18 DIAGNOSIS — R109 Unspecified abdominal pain: Secondary | ICD-10-CM | POA: Diagnosis not present

## 2016-09-18 DIAGNOSIS — N760 Acute vaginitis: Secondary | ICD-10-CM | POA: Diagnosis not present

## 2016-09-18 DIAGNOSIS — N898 Other specified noninflammatory disorders of vagina: Secondary | ICD-10-CM | POA: Diagnosis not present

## 2016-09-18 DIAGNOSIS — R5383 Other fatigue: Secondary | ICD-10-CM | POA: Diagnosis not present

## 2016-09-18 DIAGNOSIS — B9689 Other specified bacterial agents as the cause of diseases classified elsewhere: Secondary | ICD-10-CM | POA: Diagnosis not present

## 2016-09-22 DIAGNOSIS — C44519 Basal cell carcinoma of skin of other part of trunk: Secondary | ICD-10-CM | POA: Diagnosis not present

## 2016-09-22 DIAGNOSIS — L814 Other melanin hyperpigmentation: Secondary | ICD-10-CM | POA: Diagnosis not present

## 2016-09-22 DIAGNOSIS — F329 Major depressive disorder, single episode, unspecified: Secondary | ICD-10-CM | POA: Diagnosis not present

## 2016-09-22 DIAGNOSIS — L820 Inflamed seborrheic keratosis: Secondary | ICD-10-CM | POA: Diagnosis not present

## 2016-09-22 DIAGNOSIS — L821 Other seborrheic keratosis: Secondary | ICD-10-CM | POA: Diagnosis not present

## 2016-09-22 DIAGNOSIS — Z809 Family history of malignant neoplasm, unspecified: Secondary | ICD-10-CM | POA: Diagnosis not present

## 2016-09-22 DIAGNOSIS — D485 Neoplasm of uncertain behavior of skin: Secondary | ICD-10-CM | POA: Diagnosis not present

## 2016-09-22 DIAGNOSIS — D225 Melanocytic nevi of trunk: Secondary | ICD-10-CM | POA: Diagnosis not present

## 2016-09-29 DIAGNOSIS — Z79899 Other long term (current) drug therapy: Secondary | ICD-10-CM | POA: Diagnosis not present

## 2016-09-29 DIAGNOSIS — L84 Corns and callosities: Secondary | ICD-10-CM | POA: Diagnosis not present

## 2016-09-29 DIAGNOSIS — G5761 Lesion of plantar nerve, right lower limb: Secondary | ICD-10-CM | POA: Diagnosis not present

## 2016-09-29 DIAGNOSIS — F1920 Other psychoactive substance dependence, uncomplicated: Secondary | ICD-10-CM | POA: Diagnosis not present

## 2016-09-29 DIAGNOSIS — M216X1 Other acquired deformities of right foot: Secondary | ICD-10-CM | POA: Diagnosis not present

## 2016-09-29 DIAGNOSIS — F902 Attention-deficit hyperactivity disorder, combined type: Secondary | ICD-10-CM | POA: Diagnosis not present

## 2016-10-13 DIAGNOSIS — F329 Major depressive disorder, single episode, unspecified: Secondary | ICD-10-CM | POA: Diagnosis not present

## 2016-10-24 DIAGNOSIS — F329 Major depressive disorder, single episode, unspecified: Secondary | ICD-10-CM | POA: Diagnosis not present

## 2016-10-24 DIAGNOSIS — Z6821 Body mass index (BMI) 21.0-21.9, adult: Secondary | ICD-10-CM | POA: Diagnosis not present

## 2016-10-24 DIAGNOSIS — S83241A Other tear of medial meniscus, current injury, right knee, initial encounter: Secondary | ICD-10-CM | POA: Diagnosis not present

## 2016-10-24 DIAGNOSIS — M659 Synovitis and tenosynovitis, unspecified: Secondary | ICD-10-CM | POA: Diagnosis not present

## 2016-10-24 DIAGNOSIS — M25561 Pain in right knee: Secondary | ICD-10-CM | POA: Diagnosis not present

## 2016-10-31 DIAGNOSIS — C44519 Basal cell carcinoma of skin of other part of trunk: Secondary | ICD-10-CM | POA: Diagnosis not present

## 2016-11-08 DIAGNOSIS — M179 Osteoarthritis of knee, unspecified: Secondary | ICD-10-CM | POA: Diagnosis not present

## 2016-11-08 DIAGNOSIS — M7121 Synovial cyst of popliteal space [Baker], right knee: Secondary | ICD-10-CM | POA: Diagnosis not present

## 2016-11-10 ENCOUNTER — Other Ambulatory Visit: Payer: Self-pay | Admitting: Family Medicine

## 2016-11-10 DIAGNOSIS — Z1239 Encounter for other screening for malignant neoplasm of breast: Secondary | ICD-10-CM

## 2016-11-11 ENCOUNTER — Ambulatory Visit: Payer: BLUE CROSS/BLUE SHIELD

## 2016-11-11 DIAGNOSIS — M958 Other specified acquired deformities of musculoskeletal system: Secondary | ICD-10-CM | POA: Diagnosis not present

## 2016-11-17 DIAGNOSIS — F329 Major depressive disorder, single episode, unspecified: Secondary | ICD-10-CM | POA: Diagnosis not present

## 2016-11-17 DIAGNOSIS — Z79899 Other long term (current) drug therapy: Secondary | ICD-10-CM | POA: Diagnosis not present

## 2016-11-17 DIAGNOSIS — F902 Attention-deficit hyperactivity disorder, combined type: Secondary | ICD-10-CM | POA: Diagnosis not present

## 2016-11-18 DIAGNOSIS — M958 Other specified acquired deformities of musculoskeletal system: Secondary | ICD-10-CM | POA: Diagnosis not present

## 2016-11-24 DIAGNOSIS — F329 Major depressive disorder, single episode, unspecified: Secondary | ICD-10-CM | POA: Diagnosis not present

## 2016-12-08 DIAGNOSIS — M542 Cervicalgia: Secondary | ICD-10-CM | POA: Diagnosis not present

## 2016-12-08 DIAGNOSIS — F329 Major depressive disorder, single episode, unspecified: Secondary | ICD-10-CM | POA: Diagnosis not present

## 2016-12-08 DIAGNOSIS — M4123 Other idiopathic scoliosis, cervicothoracic region: Secondary | ICD-10-CM | POA: Diagnosis not present

## 2016-12-15 DIAGNOSIS — F902 Attention-deficit hyperactivity disorder, combined type: Secondary | ICD-10-CM | POA: Diagnosis not present

## 2016-12-15 DIAGNOSIS — Z79899 Other long term (current) drug therapy: Secondary | ICD-10-CM | POA: Diagnosis not present

## 2016-12-20 DIAGNOSIS — F329 Major depressive disorder, single episode, unspecified: Secondary | ICD-10-CM | POA: Diagnosis not present

## 2016-12-22 ENCOUNTER — Ambulatory Visit
Admission: RE | Admit: 2016-12-22 | Discharge: 2016-12-22 | Disposition: A | Discharge status: Home / Self Care | Payer: BLUE CROSS/BLUE SHIELD | Source: Ambulatory Visit | Attending: Family Medicine | Admitting: Family Medicine

## 2016-12-22 DIAGNOSIS — Z1239 Encounter for other screening for malignant neoplasm of breast: Secondary | ICD-10-CM

## 2016-12-22 DIAGNOSIS — Z1231 Encounter for screening mammogram for malignant neoplasm of breast: Secondary | ICD-10-CM | POA: Diagnosis not present

## 2016-12-24 DIAGNOSIS — M958 Other specified acquired deformities of musculoskeletal system: Secondary | ICD-10-CM | POA: Diagnosis not present

## 2016-12-24 DIAGNOSIS — M93261 Osteochondritis dissecans, right knee: Secondary | ICD-10-CM | POA: Diagnosis not present

## 2017-02-09 DIAGNOSIS — F902 Attention-deficit hyperactivity disorder, combined type: Secondary | ICD-10-CM | POA: Diagnosis not present

## 2017-02-09 DIAGNOSIS — Z79899 Other long term (current) drug therapy: Secondary | ICD-10-CM | POA: Diagnosis not present

## 2017-03-02 DIAGNOSIS — Z90710 Acquired absence of both cervix and uterus: Secondary | ICD-10-CM | POA: Diagnosis not present

## 2017-03-02 DIAGNOSIS — R102 Pelvic and perineal pain: Secondary | ICD-10-CM | POA: Diagnosis not present

## 2017-03-02 DIAGNOSIS — N83201 Unspecified ovarian cyst, right side: Secondary | ICD-10-CM | POA: Diagnosis not present

## 2017-03-07 DIAGNOSIS — F411 Generalized anxiety disorder: Secondary | ICD-10-CM | POA: Diagnosis not present

## 2017-03-30 DIAGNOSIS — N83201 Unspecified ovarian cyst, right side: Secondary | ICD-10-CM | POA: Diagnosis not present

## 2017-04-04 DIAGNOSIS — F411 Generalized anxiety disorder: Secondary | ICD-10-CM | POA: Diagnosis not present

## 2017-05-11 DIAGNOSIS — Z79899 Other long term (current) drug therapy: Secondary | ICD-10-CM | POA: Diagnosis not present

## 2017-05-11 DIAGNOSIS — F902 Attention-deficit hyperactivity disorder, combined type: Secondary | ICD-10-CM | POA: Diagnosis not present

## 2017-05-11 DIAGNOSIS — F419 Anxiety disorder, unspecified: Secondary | ICD-10-CM | POA: Diagnosis not present

## 2017-05-11 DIAGNOSIS — H93299 Other abnormal auditory perceptions, unspecified ear: Secondary | ICD-10-CM | POA: Diagnosis not present

## 2017-05-16 DIAGNOSIS — F411 Generalized anxiety disorder: Secondary | ICD-10-CM | POA: Diagnosis not present

## 2017-07-04 DIAGNOSIS — F411 Generalized anxiety disorder: Secondary | ICD-10-CM | POA: Diagnosis not present

## 2017-07-24 DIAGNOSIS — F411 Generalized anxiety disorder: Secondary | ICD-10-CM | POA: Diagnosis not present

## 2017-07-26 DIAGNOSIS — J00 Acute nasopharyngitis [common cold]: Secondary | ICD-10-CM | POA: Diagnosis not present

## 2017-08-04 DIAGNOSIS — C44519 Basal cell carcinoma of skin of other part of trunk: Secondary | ICD-10-CM | POA: Diagnosis not present

## 2017-08-04 DIAGNOSIS — M222X1 Patellofemoral disorders, right knee: Secondary | ICD-10-CM | POA: Diagnosis not present

## 2017-08-04 DIAGNOSIS — M85661 Other cyst of bone, right lower leg: Secondary | ICD-10-CM | POA: Diagnosis not present

## 2017-08-04 DIAGNOSIS — Z91018 Allergy to other foods: Secondary | ICD-10-CM | POA: Diagnosis not present

## 2017-08-04 DIAGNOSIS — D225 Melanocytic nevi of trunk: Secondary | ICD-10-CM | POA: Diagnosis not present

## 2017-08-04 DIAGNOSIS — M1711 Unilateral primary osteoarthritis, right knee: Secondary | ICD-10-CM | POA: Diagnosis not present

## 2017-08-04 DIAGNOSIS — D1801 Hemangioma of skin and subcutaneous tissue: Secondary | ICD-10-CM | POA: Diagnosis not present

## 2017-08-04 DIAGNOSIS — L578 Other skin changes due to chronic exposure to nonionizing radiation: Secondary | ICD-10-CM | POA: Diagnosis not present

## 2017-08-04 DIAGNOSIS — M2410 Other articular cartilage disorders, unspecified site: Secondary | ICD-10-CM | POA: Diagnosis not present

## 2017-08-04 DIAGNOSIS — D2271 Melanocytic nevi of right lower limb, including hip: Secondary | ICD-10-CM | POA: Diagnosis not present

## 2017-08-04 DIAGNOSIS — D485 Neoplasm of uncertain behavior of skin: Secondary | ICD-10-CM | POA: Diagnosis not present

## 2017-08-05 DIAGNOSIS — F902 Attention-deficit hyperactivity disorder, combined type: Secondary | ICD-10-CM | POA: Diagnosis not present

## 2017-08-05 DIAGNOSIS — Z79899 Other long term (current) drug therapy: Secondary | ICD-10-CM | POA: Diagnosis not present

## 2017-08-15 DIAGNOSIS — F411 Generalized anxiety disorder: Secondary | ICD-10-CM | POA: Diagnosis not present

## 2017-09-05 DIAGNOSIS — F411 Generalized anxiety disorder: Secondary | ICD-10-CM | POA: Diagnosis not present

## 2017-09-19 DIAGNOSIS — F411 Generalized anxiety disorder: Secondary | ICD-10-CM | POA: Diagnosis not present

## 2017-09-25 DIAGNOSIS — H6980 Other specified disorders of Eustachian tube, unspecified ear: Secondary | ICD-10-CM | POA: Diagnosis not present

## 2017-09-29 DIAGNOSIS — H903 Sensorineural hearing loss, bilateral: Secondary | ICD-10-CM | POA: Diagnosis not present

## 2017-09-29 DIAGNOSIS — H6983 Other specified disorders of Eustachian tube, bilateral: Secondary | ICD-10-CM | POA: Diagnosis not present

## 2017-09-29 DIAGNOSIS — H73893 Other specified disorders of tympanic membrane, bilateral: Secondary | ICD-10-CM | POA: Diagnosis not present

## 2017-10-10 DIAGNOSIS — F4323 Adjustment disorder with mixed anxiety and depressed mood: Secondary | ICD-10-CM | POA: Diagnosis not present

## 2017-10-17 DIAGNOSIS — F411 Generalized anxiety disorder: Secondary | ICD-10-CM | POA: Diagnosis not present

## 2017-10-23 DIAGNOSIS — J343 Hypertrophy of nasal turbinates: Secondary | ICD-10-CM | POA: Diagnosis not present

## 2017-10-23 DIAGNOSIS — Z87891 Personal history of nicotine dependence: Secondary | ICD-10-CM | POA: Diagnosis not present

## 2017-10-23 DIAGNOSIS — H6982 Other specified disorders of Eustachian tube, left ear: Secondary | ICD-10-CM | POA: Diagnosis not present

## 2017-10-23 DIAGNOSIS — Z9089 Acquired absence of other organs: Secondary | ICD-10-CM | POA: Diagnosis not present

## 2017-10-31 DIAGNOSIS — F411 Generalized anxiety disorder: Secondary | ICD-10-CM | POA: Diagnosis not present

## 2017-11-14 DIAGNOSIS — F411 Generalized anxiety disorder: Secondary | ICD-10-CM | POA: Diagnosis not present

## 2017-11-16 DIAGNOSIS — Z79899 Other long term (current) drug therapy: Secondary | ICD-10-CM | POA: Diagnosis not present

## 2017-11-16 DIAGNOSIS — F902 Attention-deficit hyperactivity disorder, combined type: Secondary | ICD-10-CM | POA: Diagnosis not present

## 2017-11-16 DIAGNOSIS — H93299 Other abnormal auditory perceptions, unspecified ear: Secondary | ICD-10-CM | POA: Diagnosis not present

## 2017-11-16 DIAGNOSIS — F419 Anxiety disorder, unspecified: Secondary | ICD-10-CM | POA: Diagnosis not present

## 2017-11-16 DIAGNOSIS — H6982 Other specified disorders of Eustachian tube, left ear: Secondary | ICD-10-CM | POA: Diagnosis not present

## 2017-11-28 DIAGNOSIS — F411 Generalized anxiety disorder: Secondary | ICD-10-CM | POA: Diagnosis not present

## 2017-12-02 ENCOUNTER — Other Ambulatory Visit: Payer: Self-pay | Admitting: Family Medicine

## 2017-12-02 DIAGNOSIS — L84 Corns and callosities: Secondary | ICD-10-CM | POA: Diagnosis not present

## 2017-12-02 DIAGNOSIS — G5761 Lesion of plantar nerve, right lower limb: Secondary | ICD-10-CM | POA: Diagnosis not present

## 2017-12-02 DIAGNOSIS — M216X1 Other acquired deformities of right foot: Secondary | ICD-10-CM | POA: Diagnosis not present

## 2017-12-02 DIAGNOSIS — Z1231 Encounter for screening mammogram for malignant neoplasm of breast: Secondary | ICD-10-CM

## 2017-12-12 DIAGNOSIS — F411 Generalized anxiety disorder: Secondary | ICD-10-CM | POA: Diagnosis not present

## 2017-12-14 DIAGNOSIS — S83281A Other tear of lateral meniscus, current injury, right knee, initial encounter: Secondary | ICD-10-CM | POA: Diagnosis not present

## 2017-12-21 ENCOUNTER — Other Ambulatory Visit: Payer: Self-pay | Admitting: Family Medicine

## 2017-12-21 DIAGNOSIS — N6452 Nipple discharge: Secondary | ICD-10-CM | POA: Diagnosis not present

## 2017-12-21 DIAGNOSIS — Z9622 Myringotomy tube(s) status: Secondary | ICD-10-CM | POA: Diagnosis not present

## 2017-12-21 DIAGNOSIS — H6982 Other specified disorders of Eustachian tube, left ear: Secondary | ICD-10-CM | POA: Diagnosis not present

## 2017-12-21 DIAGNOSIS — N6320 Unspecified lump in the left breast, unspecified quadrant: Secondary | ICD-10-CM | POA: Diagnosis not present

## 2017-12-24 ENCOUNTER — Ambulatory Visit
Admission: RE | Admit: 2017-12-24 | Discharge: 2017-12-24 | Disposition: A | Discharge status: Home / Self Care | Payer: BLUE CROSS/BLUE SHIELD | Source: Ambulatory Visit | Attending: Family Medicine | Admitting: Family Medicine

## 2017-12-24 ENCOUNTER — Other Ambulatory Visit: Payer: Self-pay | Admitting: Family Medicine

## 2017-12-24 DIAGNOSIS — N6320 Unspecified lump in the left breast, unspecified quadrant: Secondary | ICD-10-CM

## 2017-12-24 DIAGNOSIS — R922 Inconclusive mammogram: Secondary | ICD-10-CM | POA: Diagnosis not present

## 2017-12-24 DIAGNOSIS — N6012 Diffuse cystic mastopathy of left breast: Secondary | ICD-10-CM | POA: Diagnosis not present

## 2018-01-04 ENCOUNTER — Other Ambulatory Visit: Payer: Self-pay | Admitting: Surgery

## 2018-01-04 DIAGNOSIS — N6452 Nipple discharge: Secondary | ICD-10-CM | POA: Diagnosis not present

## 2018-01-06 ENCOUNTER — Other Ambulatory Visit: Payer: Self-pay | Admitting: Surgery

## 2018-01-06 DIAGNOSIS — N6452 Nipple discharge: Secondary | ICD-10-CM

## 2018-01-11 ENCOUNTER — Ambulatory Visit: Payer: BLUE CROSS/BLUE SHIELD

## 2018-01-16 ENCOUNTER — Other Ambulatory Visit: Payer: BLUE CROSS/BLUE SHIELD

## 2018-01-16 ENCOUNTER — Ambulatory Visit
Admission: RE | Admit: 2018-01-16 | Discharge: 2018-01-16 | Disposition: A | Discharge status: Home / Self Care | Payer: BLUE CROSS/BLUE SHIELD | Source: Ambulatory Visit | Attending: Surgery | Admitting: Surgery

## 2018-01-16 DIAGNOSIS — N6452 Nipple discharge: Secondary | ICD-10-CM | POA: Diagnosis not present

## 2018-01-16 MED ORDER — GADOBUTROL 1 MMOL/ML IV SOLN
8.0000 mL | Freq: Once | INTRAVENOUS | Status: AC | PRN
  Administered 2018-01-16: 8 mL via INTRAVENOUS

## 2018-01-19 DIAGNOSIS — D225 Melanocytic nevi of trunk: Secondary | ICD-10-CM | POA: Diagnosis not present

## 2018-01-19 DIAGNOSIS — M1711 Unilateral primary osteoarthritis, right knee: Secondary | ICD-10-CM | POA: Diagnosis not present

## 2018-01-19 DIAGNOSIS — C44519 Basal cell carcinoma of skin of other part of trunk: Secondary | ICD-10-CM | POA: Diagnosis not present

## 2018-01-19 DIAGNOSIS — M8560 Other cyst of bone, unspecified site: Secondary | ICD-10-CM | POA: Diagnosis not present

## 2018-01-19 DIAGNOSIS — S83261A Peripheral tear of lateral meniscus, current injury, right knee, initial encounter: Secondary | ICD-10-CM | POA: Diagnosis not present

## 2018-01-19 DIAGNOSIS — M25861 Other specified joint disorders, right knee: Secondary | ICD-10-CM | POA: Diagnosis not present

## 2018-02-06 DIAGNOSIS — F4323 Adjustment disorder with mixed anxiety and depressed mood: Secondary | ICD-10-CM | POA: Diagnosis not present

## 2018-02-08 DIAGNOSIS — Z79899 Other long term (current) drug therapy: Secondary | ICD-10-CM | POA: Diagnosis not present

## 2018-02-08 DIAGNOSIS — F902 Attention-deficit hyperactivity disorder, combined type: Secondary | ICD-10-CM | POA: Diagnosis not present

## 2018-02-08 DIAGNOSIS — N6452 Nipple discharge: Secondary | ICD-10-CM | POA: Diagnosis not present

## 2018-02-13 DIAGNOSIS — F4323 Adjustment disorder with mixed anxiety and depressed mood: Secondary | ICD-10-CM | POA: Diagnosis not present

## 2018-02-23 DIAGNOSIS — F4323 Adjustment disorder with mixed anxiety and depressed mood: Secondary | ICD-10-CM | POA: Diagnosis not present

## 2018-03-06 DIAGNOSIS — F4323 Adjustment disorder with mixed anxiety and depressed mood: Secondary | ICD-10-CM | POA: Diagnosis not present

## 2018-03-27 DIAGNOSIS — F4323 Adjustment disorder with mixed anxiety and depressed mood: Secondary | ICD-10-CM | POA: Diagnosis not present

## 2018-04-03 DIAGNOSIS — F4323 Adjustment disorder with mixed anxiety and depressed mood: Secondary | ICD-10-CM | POA: Diagnosis not present

## 2018-04-17 DIAGNOSIS — F4323 Adjustment disorder with mixed anxiety and depressed mood: Secondary | ICD-10-CM | POA: Diagnosis not present

## 2018-04-24 DIAGNOSIS — F4323 Adjustment disorder with mixed anxiety and depressed mood: Secondary | ICD-10-CM | POA: Diagnosis not present

## 2018-05-01 DIAGNOSIS — F4323 Adjustment disorder with mixed anxiety and depressed mood: Secondary | ICD-10-CM | POA: Diagnosis not present

## 2018-05-08 DIAGNOSIS — F4323 Adjustment disorder with mixed anxiety and depressed mood: Secondary | ICD-10-CM | POA: Diagnosis not present

## 2018-05-10 DIAGNOSIS — F419 Anxiety disorder, unspecified: Secondary | ICD-10-CM | POA: Diagnosis not present

## 2018-05-10 DIAGNOSIS — Z79899 Other long term (current) drug therapy: Secondary | ICD-10-CM | POA: Diagnosis not present

## 2018-05-10 DIAGNOSIS — F902 Attention-deficit hyperactivity disorder, combined type: Secondary | ICD-10-CM | POA: Diagnosis not present

## 2018-05-15 DIAGNOSIS — F4323 Adjustment disorder with mixed anxiety and depressed mood: Secondary | ICD-10-CM | POA: Diagnosis not present

## 2018-05-29 DIAGNOSIS — F4323 Adjustment disorder with mixed anxiety and depressed mood: Secondary | ICD-10-CM | POA: Diagnosis not present

## 2018-06-12 DIAGNOSIS — F4323 Adjustment disorder with mixed anxiety and depressed mood: Secondary | ICD-10-CM | POA: Diagnosis not present

## 2018-06-19 DIAGNOSIS — F4323 Adjustment disorder with mixed anxiety and depressed mood: Secondary | ICD-10-CM | POA: Diagnosis not present

## 2018-06-26 DIAGNOSIS — F4323 Adjustment disorder with mixed anxiety and depressed mood: Secondary | ICD-10-CM | POA: Diagnosis not present

## 2018-06-28 ENCOUNTER — Other Ambulatory Visit: Payer: Self-pay | Admitting: Family Medicine

## 2018-06-28 ENCOUNTER — Ambulatory Visit
Admission: RE | Admit: 2018-06-28 | Discharge: 2018-06-28 | Disposition: A | Discharge status: Home / Self Care | Payer: BLUE CROSS/BLUE SHIELD | Source: Ambulatory Visit | Attending: Family Medicine | Admitting: Family Medicine

## 2018-06-28 ENCOUNTER — Other Ambulatory Visit: Payer: Self-pay

## 2018-06-28 DIAGNOSIS — N6002 Solitary cyst of left breast: Secondary | ICD-10-CM | POA: Diagnosis not present

## 2018-06-28 DIAGNOSIS — N6320 Unspecified lump in the left breast, unspecified quadrant: Secondary | ICD-10-CM

## 2018-12-30 ENCOUNTER — Other Ambulatory Visit: Payer: BLUE CROSS/BLUE SHIELD

## 2019-04-29 IMAGING — MG DIGITAL DIAGNOSTIC BILATERAL MAMMOGRAM WITH IMPLANTS, CAD AND TO
8 of 12 series · 8 of 28 positions shown · non-contrast
Comparison: Previous exam(s).

Addendum:
CLINICAL DATA: Patient complains of clear left nipple discharge
only when she is intimate with her husband. Patient's physician
palpated an abnormality in the 11 o'clock region of the left breast.

EXAM:
DIGITAL DIAGNOSTIC BILATERAL MAMMOGRAM WITH IMPLANTS, CAD AND TOMO
ULTRASOUND LEFT BREAST
The patient has retropectoral implants. Standard and implant
displaced views were performed.

[L MLO]
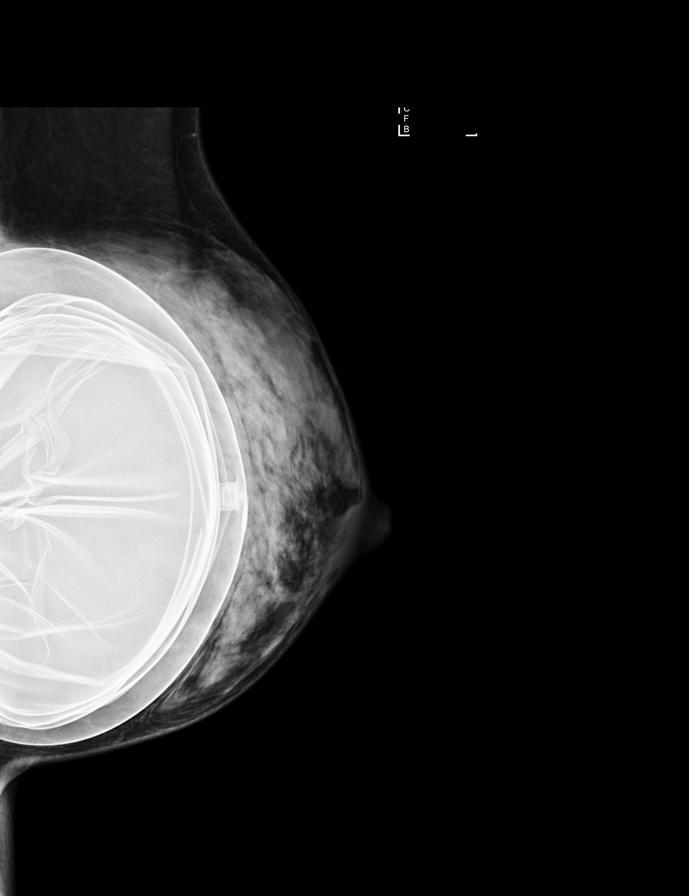

[R MLO]
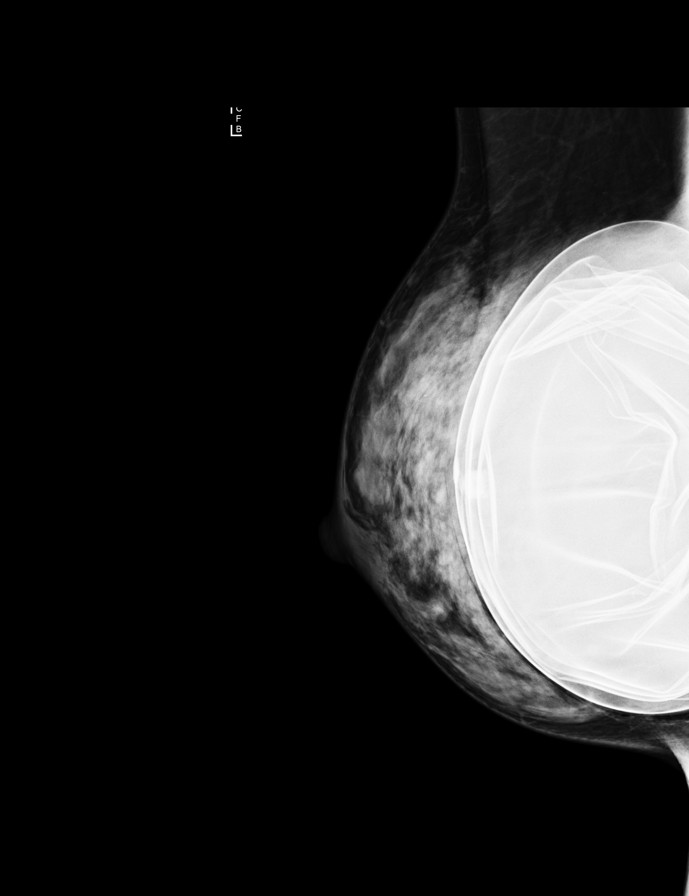

[R CC]
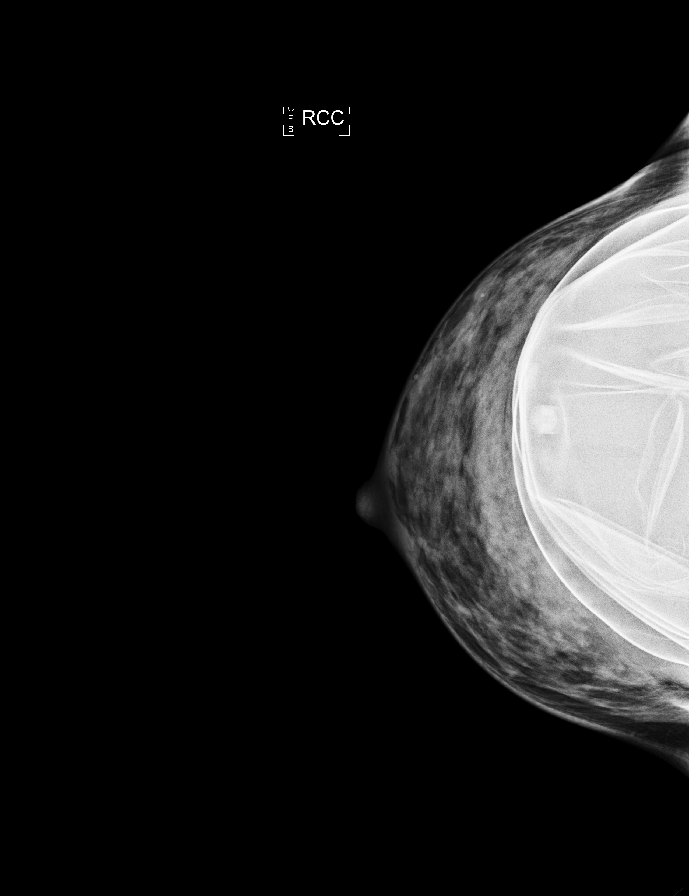

[L CC]
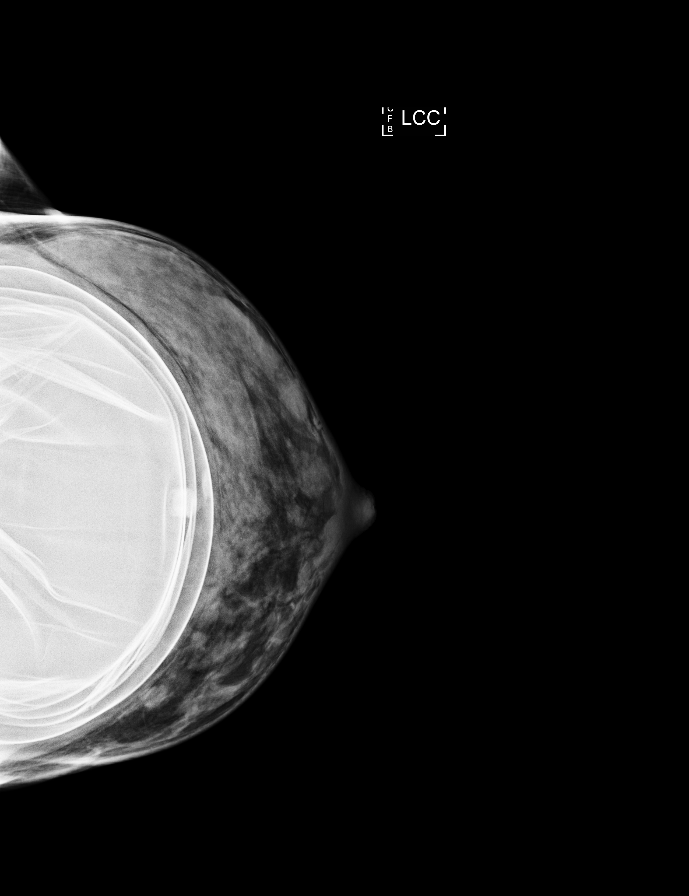

[R MLO synth-2D]
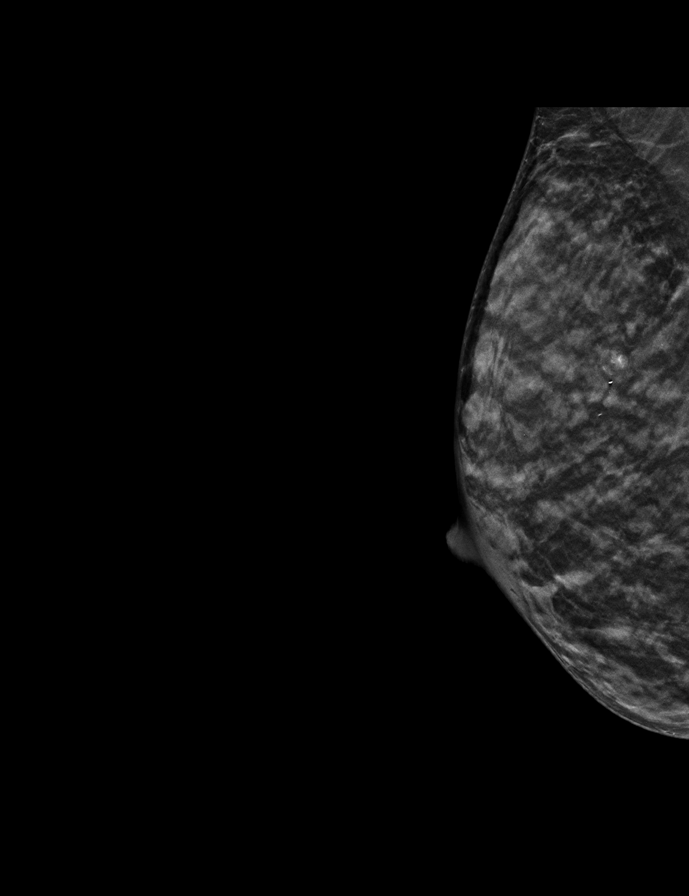

[R CC synth-2D]
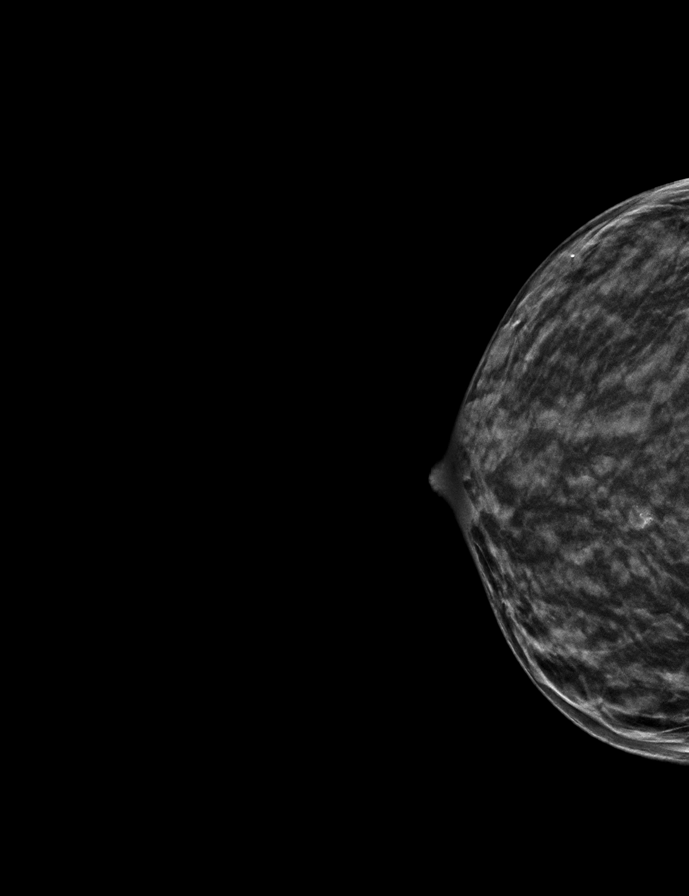

[L CC synth-2D]
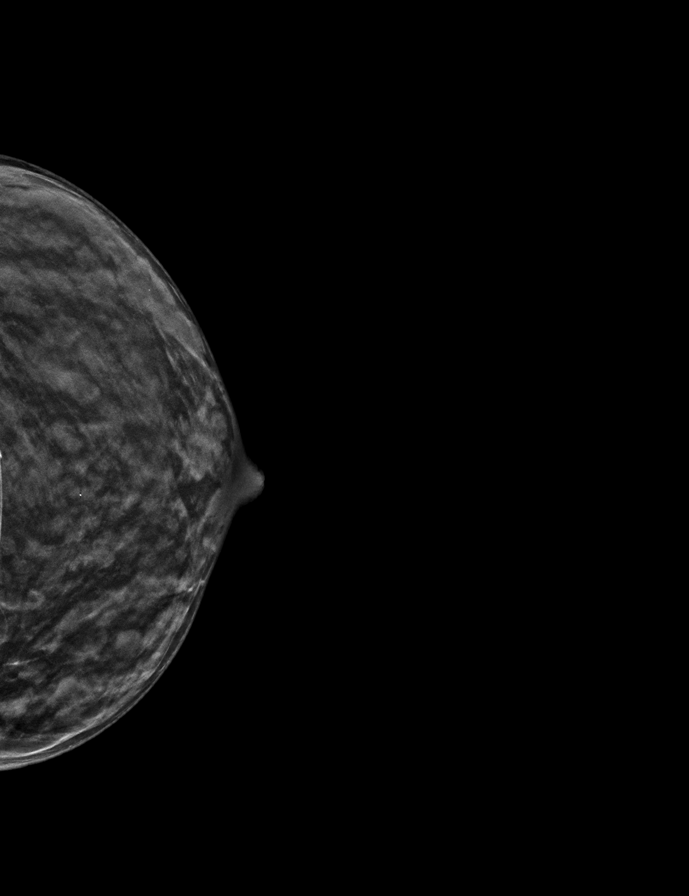

[L MLO synth-2D]
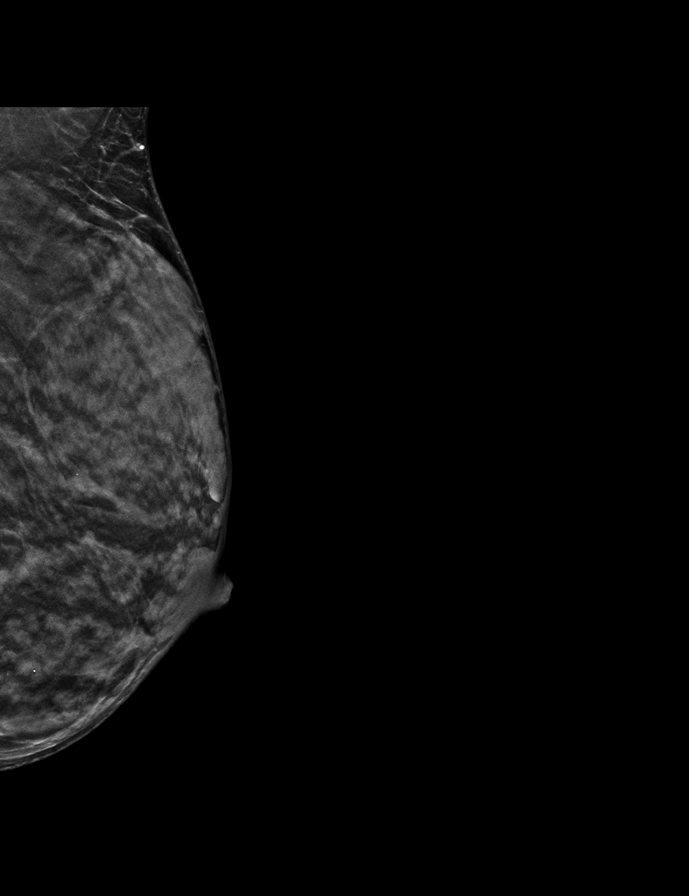

[8 of 28 positions shown; findings below may reference images not displayed]

ACR Breast Density Category d: The breast tissue is extremely dense,
which lowers the sensitivity of mammography.
FINDINGS: No suspicious mass, malignant type microcalcifications or distortion
detected in either breast.

On physical exam, I palpate mild thickening in the left breast at 11
o'clock 3 cm from the nipple. No nipple discharge could be expressed
from the left breast.

Targeted ultrasound is performed, showing a probable cluster of
cysts (apocrine metaplasia in the left breast 11 o'clock 3 cm from
the nipple measuring 7 x 4 x 9 mm. Sonographic evaluation of the
subareolar region of the left breast does not show enlarged ducts or
an intraductal mass.

Mammographic images were processed with CAD.
IMPRESSION: Probable benign apocrine metaplasia in the 11 o'clock region of the
left breast.

RECOMMENDATION:
Short-term interval follow-up left breast ultrasound in 6 months for
the probable benign apocrine metaplasia is recommended.

Surgical consultation and possible MRI of the breasts is recommended
for the left nipple discharge.

I have discussed the findings and recommendations with the patient.
Results were also provided in writing at the conclusion of the
visit. If applicable, a reminder letter will be sent to the patient
regarding the next appointment.

BI-RADS CATEGORY  3: Probably benign.

ADDENDUM:
Surgical consultation has been arranged with Dr. Aleksandra Katarzyna Iskierka at
[REDACTED] on January 04, 2018.

Pathology results reported by Alcine Ducasse, RN on 12/30/2017.

*** End of Addendum ***

## 2019-11-01 IMAGING — US US BREAST*L* LIMITED INC AXILLA
1 series · 5 of 5 positions shown · non-contrast
Comparison: Previous exam(s).

CLINICAL DATA: 43-year-old patient presents for six-month follow-up
of probably benign cluster of cysts in the left breast. She had a
bilateral breast MRI in December 2017 that showed small scattered
cysts bilaterally no suspicious findings.

EXAM:
ULTRASOUND OF THE LEFT BREAST

[Series 1: us breast*left* limited inc axilla · 0.06mm/px · 5 of 5 slices shown]
[im 1/5]
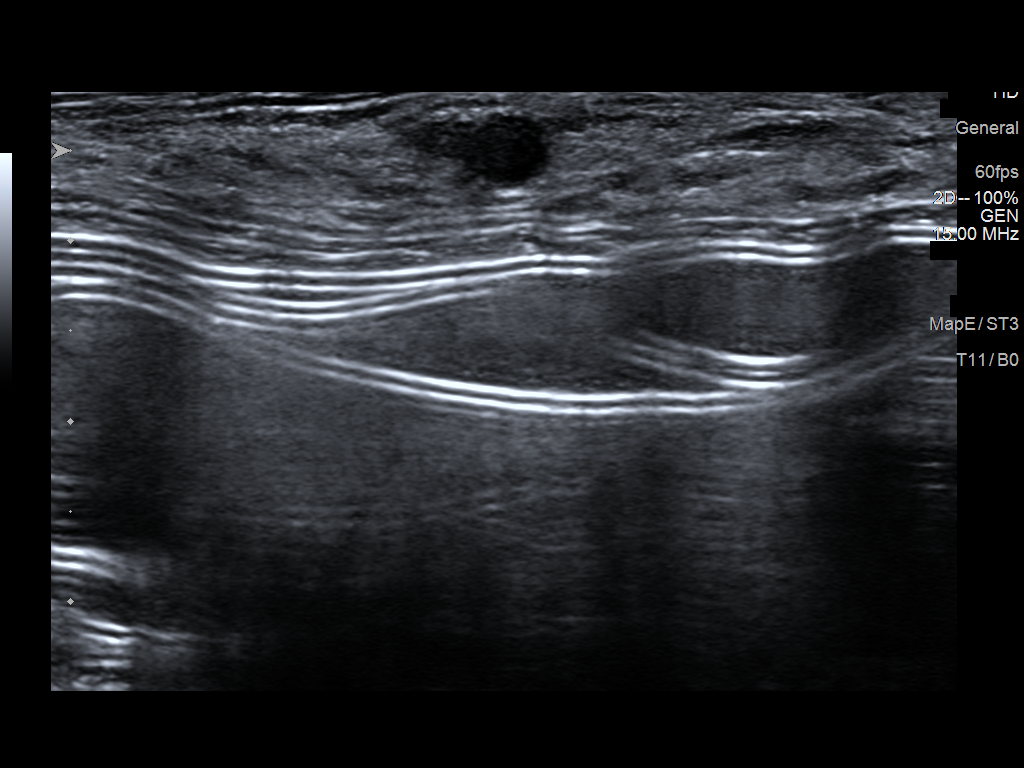
[im 2/5]
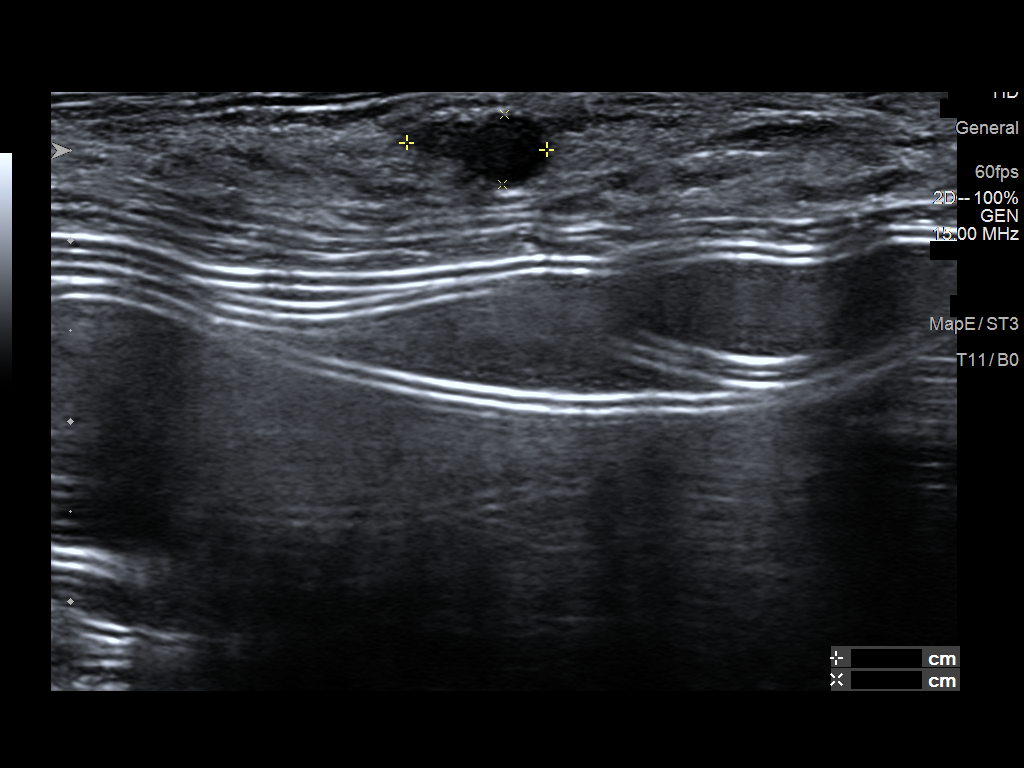
[im 3/5]
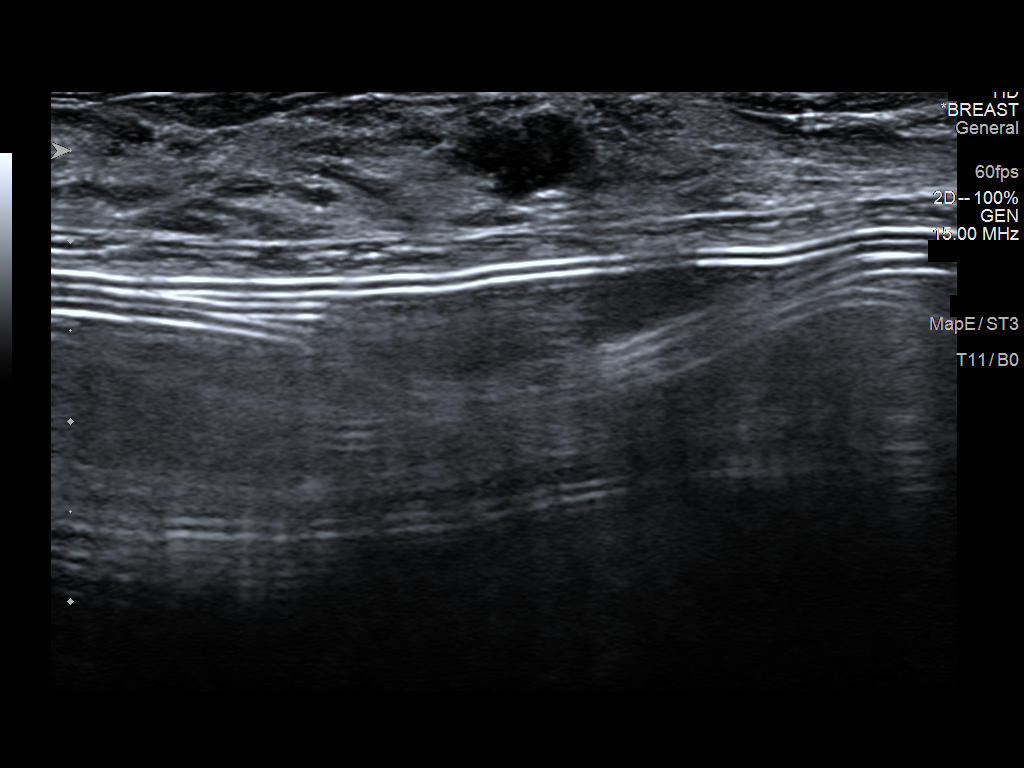
[im 4/5]
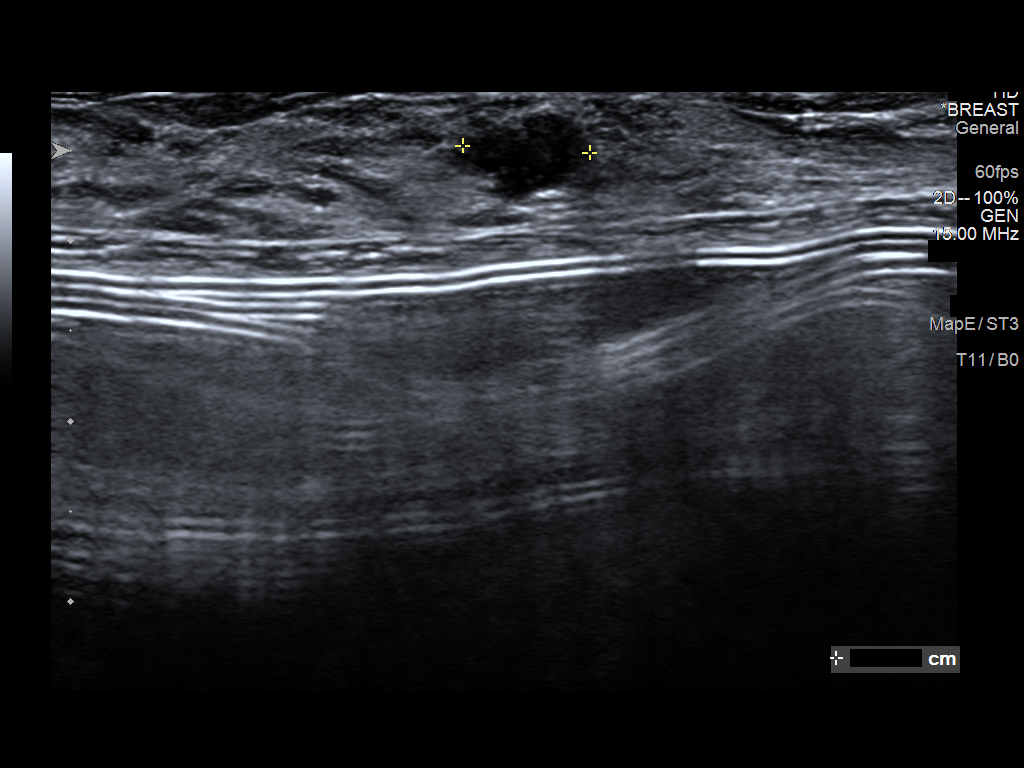
[im 5/5]
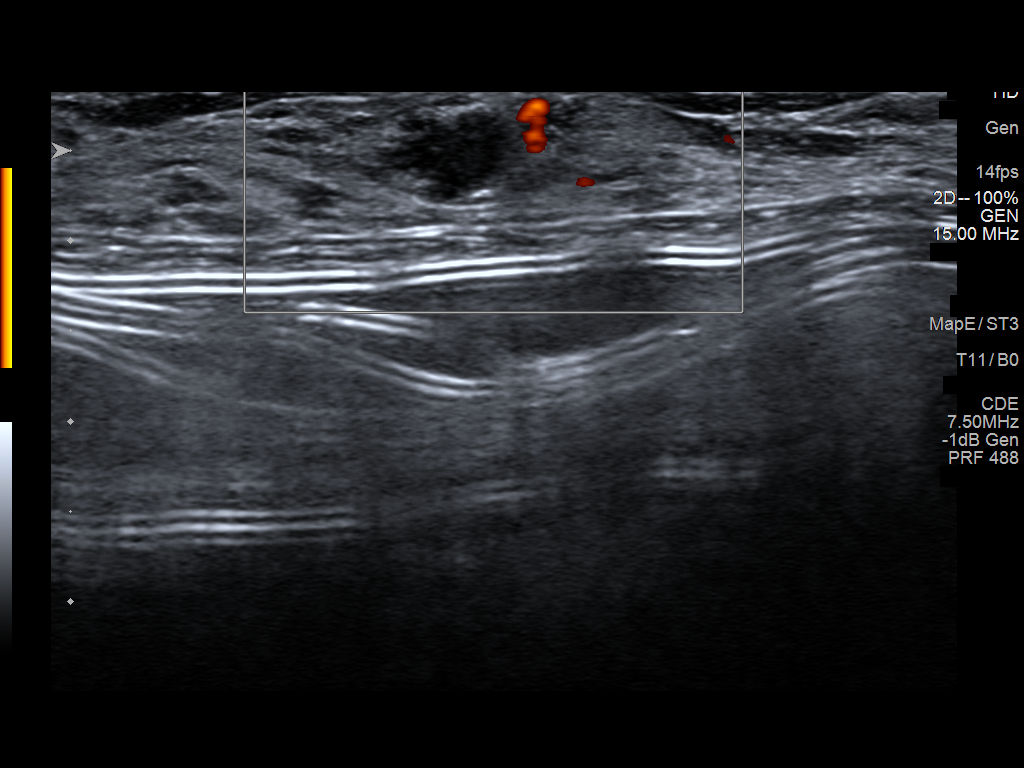

[5 of 5 positions shown; findings below may reference images not displayed]

FINDINGS: Targeted ultrasound is performed, showing a probable cluster of
cysts at 11 o'clock position 3 cm from the nipple measuring 0.7 x
0.8 x 0.4 cm. This is stable. There is no internal vascular flow.
IMPRESSION: Stable probably benign findings in the left breast.

RECOMMENDATION:
Bilateral diagnostic mammogram and possible left breast ultrasound
is recommended in 6 months to complete a 1 year follow-up.

I have discussed the findings and recommendations with the patient.
Results were also provided in writing at the conclusion of the
visit. If applicable, a reminder letter will be sent to the patient
regarding the next appointment.

BI-RADS CATEGORY  3: Probably benign.
